# Patient Record
Sex: Male | Born: 1940 | Race: Black or African American | Hispanic: No | Smoking: Never smoker
Health system: Southern US, Community
[De-identification: ages and names within clinical notes are randomized; demographics above are authoritative.]

## PROBLEM LIST (undated history)

## (undated) DIAGNOSIS — R569 Unspecified convulsions: Secondary | ICD-10-CM

## (undated) DIAGNOSIS — I10 Essential (primary) hypertension: Secondary | ICD-10-CM

## (undated) DIAGNOSIS — E119 Type 2 diabetes mellitus without complications: Secondary | ICD-10-CM

## (undated) DIAGNOSIS — I509 Heart failure, unspecified: Secondary | ICD-10-CM

## (undated) HISTORY — PX: CORONARY ARTERY BYPASS GRAFT: SHX141

---

## 1998-07-02 ENCOUNTER — Emergency Department (HOSPITAL_COMMUNITY): Admission: EM | Admit: 1998-07-02 | Discharge: 1998-07-02 | Payer: Self-pay | Admitting: Emergency Medicine

## 1999-01-05 ENCOUNTER — Encounter: Payer: Self-pay | Admitting: Emergency Medicine

## 1999-01-05 ENCOUNTER — Emergency Department (HOSPITAL_COMMUNITY): Admission: EM | Admit: 1999-01-05 | Discharge: 1999-01-05 | Payer: Self-pay | Admitting: Emergency Medicine

## 2000-02-03 ENCOUNTER — Ambulatory Visit (HOSPITAL_COMMUNITY): Admission: RE | Admit: 2000-02-03 | Discharge: 2000-02-03 | Payer: Self-pay | Admitting: Family Medicine

## 2000-02-03 ENCOUNTER — Encounter: Payer: Self-pay | Admitting: Family Medicine

## 2001-08-07 ENCOUNTER — Encounter: Payer: Self-pay | Admitting: Family Medicine

## 2001-08-07 ENCOUNTER — Ambulatory Visit (HOSPITAL_COMMUNITY): Admission: RE | Admit: 2001-08-07 | Discharge: 2001-08-07 | Payer: Self-pay | Admitting: Family Medicine

## 2001-09-08 ENCOUNTER — Emergency Department (HOSPITAL_COMMUNITY): Admission: EM | Admit: 2001-09-08 | Discharge: 2001-09-08 | Payer: Self-pay | Admitting: Emergency Medicine

## 2002-07-23 ENCOUNTER — Encounter (HOSPITAL_BASED_OUTPATIENT_CLINIC_OR_DEPARTMENT_OTHER): Admission: RE | Admit: 2002-07-23 | Discharge: 2002-10-21 | Payer: Self-pay | Admitting: Internal Medicine

## 2002-08-12 ENCOUNTER — Encounter: Payer: Self-pay | Admitting: Orthopedic Surgery

## 2002-08-12 ENCOUNTER — Encounter: Admission: RE | Admit: 2002-08-12 | Discharge: 2002-08-12 | Payer: Self-pay | Admitting: Orthopedic Surgery

## 2002-10-09 ENCOUNTER — Ambulatory Visit (HOSPITAL_COMMUNITY): Admission: RE | Admit: 2002-10-09 | Discharge: 2002-10-09 | Payer: Self-pay | Admitting: Family Medicine

## 2002-10-09 ENCOUNTER — Encounter: Payer: Self-pay | Admitting: Family Medicine

## 2003-11-20 ENCOUNTER — Ambulatory Visit: Payer: Self-pay | Admitting: Family Medicine

## 2003-11-27 ENCOUNTER — Ambulatory Visit: Payer: Self-pay | Admitting: *Deleted

## 2003-12-29 ENCOUNTER — Ambulatory Visit: Payer: Self-pay | Admitting: *Deleted

## 2004-02-18 ENCOUNTER — Ambulatory Visit: Payer: Self-pay | Admitting: Family Medicine

## 2004-04-22 ENCOUNTER — Ambulatory Visit: Payer: Self-pay | Admitting: Family Medicine

## 2005-03-17 ENCOUNTER — Ambulatory Visit: Payer: Self-pay | Admitting: Family Medicine

## 2005-04-24 ENCOUNTER — Ambulatory Visit: Payer: Self-pay | Admitting: Family Medicine

## 2005-06-15 ENCOUNTER — Ambulatory Visit: Payer: Self-pay | Admitting: Family Medicine

## 2005-09-04 ENCOUNTER — Ambulatory Visit: Payer: Self-pay | Admitting: Family Medicine

## 2006-02-06 ENCOUNTER — Ambulatory Visit: Payer: Self-pay | Admitting: Family Medicine

## 2006-05-04 ENCOUNTER — Ambulatory Visit: Payer: Self-pay | Admitting: Family Medicine

## 2006-07-09 ENCOUNTER — Ambulatory Visit: Payer: Self-pay | Admitting: Family Medicine

## 2006-09-03 ENCOUNTER — Ambulatory Visit: Payer: Self-pay | Admitting: Family Medicine

## 2006-11-15 ENCOUNTER — Ambulatory Visit: Payer: Self-pay | Admitting: Cardiology

## 2006-11-15 ENCOUNTER — Inpatient Hospital Stay (HOSPITAL_COMMUNITY): Admission: EM | Admit: 2006-11-15 | Discharge: 2006-12-03 | Payer: Self-pay | Admitting: Emergency Medicine

## 2006-11-21 ENCOUNTER — Encounter (INDEPENDENT_AMBULATORY_CARE_PROVIDER_SITE_OTHER): Payer: Self-pay | Admitting: Internal Medicine

## 2006-11-21 ENCOUNTER — Ambulatory Visit: Payer: Self-pay | Admitting: Cardiovascular Disease

## 2006-12-12 ENCOUNTER — Ambulatory Visit: Payer: Self-pay | Admitting: Cardiology

## 2006-12-12 LAB — CONVERTED CEMR LAB
Basophils Absolute: 0 10*3/uL (ref 0.0–0.1)
Chloride: 106 meq/L (ref 96–112)
Eosinophils Absolute: 0.2 10*3/uL (ref 0.0–0.6)
Eosinophils Relative: 2.6 % (ref 0.0–5.0)
GFR calc Af Amer: 49 mL/min
GFR calc non Af Amer: 40 mL/min
Glucose, Bld: 88 mg/dL (ref 70–99)
HCT: 34.9 % — ABNORMAL LOW (ref 39.0–52.0)
Lymphocytes Relative: 17.7 % (ref 12.0–46.0)
MCHC: 33.5 g/dL (ref 30.0–36.0)
MCV: 97.8 fL (ref 78.0–100.0)
Neutro Abs: 5.1 10*3/uL (ref 1.4–7.7)
Neutrophils Relative %: 64.4 % (ref 43.0–77.0)
Platelets: 221 10*3/uL (ref 150–400)
RBC: 3.57 M/uL — ABNORMAL LOW (ref 4.22–5.81)
Sodium: 138 meq/L (ref 135–145)
WBC: 7.9 10*3/uL (ref 4.5–10.5)

## 2006-12-19 ENCOUNTER — Ambulatory Visit: Payer: Self-pay | Admitting: Family Medicine

## 2006-12-24 ENCOUNTER — Inpatient Hospital Stay (HOSPITAL_COMMUNITY): Admission: AD | Admit: 2006-12-24 | Discharge: 2006-12-26 | Payer: Self-pay | Admitting: Cardiology

## 2006-12-24 ENCOUNTER — Ambulatory Visit: Payer: Self-pay | Admitting: Cardiology

## 2007-01-02 ENCOUNTER — Ambulatory Visit: Payer: Self-pay | Admitting: Cardiology

## 2007-01-02 LAB — CONVERTED CEMR LAB
BUN: 50 mg/dL — ABNORMAL HIGH (ref 6–23)
GFR calc Af Amer: 60 mL/min
GFR calc non Af Amer: 50 mL/min
Potassium: 5 meq/L (ref 3.5–5.1)
Sodium: 138 meq/L (ref 135–145)

## 2007-01-08 ENCOUNTER — Emergency Department (HOSPITAL_COMMUNITY): Admission: EM | Admit: 2007-01-08 | Discharge: 2007-01-08 | Payer: Self-pay | Admitting: Emergency Medicine

## 2007-01-15 ENCOUNTER — Ambulatory Visit: Payer: Self-pay | Admitting: Cardiology

## 2007-02-08 ENCOUNTER — Encounter: Payer: Self-pay | Admitting: Endocrinology

## 2007-02-18 ENCOUNTER — Ambulatory Visit: Payer: Self-pay | Admitting: Internal Medicine

## 2007-02-18 LAB — CONVERTED CEMR LAB
BUN: 86 mg/dL (ref 6–23)
Calcium: 9.2 mg/dL (ref 8.4–10.5)
GFR calc Af Amer: 46 mL/min
GFR calc non Af Amer: 38 mL/min
Potassium: 4.5 meq/L (ref 3.5–5.1)
Pro B Natriuretic peptide (BNP): 203 pg/mL — ABNORMAL HIGH (ref 0.0–100.0)

## 2007-02-28 ENCOUNTER — Ambulatory Visit: Payer: Self-pay | Admitting: Family Medicine

## 2007-03-25 ENCOUNTER — Ambulatory Visit: Payer: Self-pay | Admitting: Cardiology

## 2007-04-22 ENCOUNTER — Emergency Department (HOSPITAL_COMMUNITY): Admission: EM | Admit: 2007-04-22 | Discharge: 2007-04-22 | Payer: Self-pay | Admitting: Emergency Medicine

## 2007-05-01 ENCOUNTER — Ambulatory Visit: Payer: Self-pay | Admitting: Cardiology

## 2007-05-08 ENCOUNTER — Encounter: Payer: Self-pay | Admitting: Endocrinology

## 2007-06-21 ENCOUNTER — Emergency Department (HOSPITAL_COMMUNITY): Admission: EM | Admit: 2007-06-21 | Discharge: 2007-06-22 | Payer: Self-pay | Admitting: Emergency Medicine

## 2007-06-22 ENCOUNTER — Ambulatory Visit: Payer: Self-pay | Admitting: Vascular Surgery

## 2007-06-22 ENCOUNTER — Encounter (INDEPENDENT_AMBULATORY_CARE_PROVIDER_SITE_OTHER): Payer: Self-pay | Admitting: Emergency Medicine

## 2007-06-22 ENCOUNTER — Ambulatory Visit (HOSPITAL_COMMUNITY): Admission: RE | Admit: 2007-06-22 | Discharge: 2007-06-22 | Payer: Self-pay | Admitting: Emergency Medicine

## 2007-07-04 ENCOUNTER — Ambulatory Visit: Payer: Self-pay | Admitting: Internal Medicine

## 2007-07-22 ENCOUNTER — Ambulatory Visit: Payer: Self-pay | Admitting: Cardiovascular Disease

## 2007-08-05 ENCOUNTER — Ambulatory Visit: Payer: Self-pay | Admitting: Family Medicine

## 2007-08-26 ENCOUNTER — Ambulatory Visit: Payer: Self-pay

## 2007-08-26 ENCOUNTER — Encounter: Payer: Self-pay | Admitting: Cardiology

## 2007-08-28 ENCOUNTER — Ambulatory Visit: Payer: Self-pay | Admitting: Cardiology

## 2007-09-09 ENCOUNTER — Emergency Department (HOSPITAL_COMMUNITY): Admission: EM | Admit: 2007-09-09 | Discharge: 2007-09-10 | Payer: Self-pay | Admitting: Emergency Medicine

## 2008-11-03 ENCOUNTER — Encounter: Payer: Self-pay | Admitting: Cardiology

## 2009-03-31 IMAGING — CR DG CHEST 1V PORT
1 series · 1 of 1 positions shown · non-contrast
Comparison: Chest, 11/24/2006 at 3748 hours

CLINICAL DATA: PICC line placement

Exam: Chest portable one view

[view not recorded]
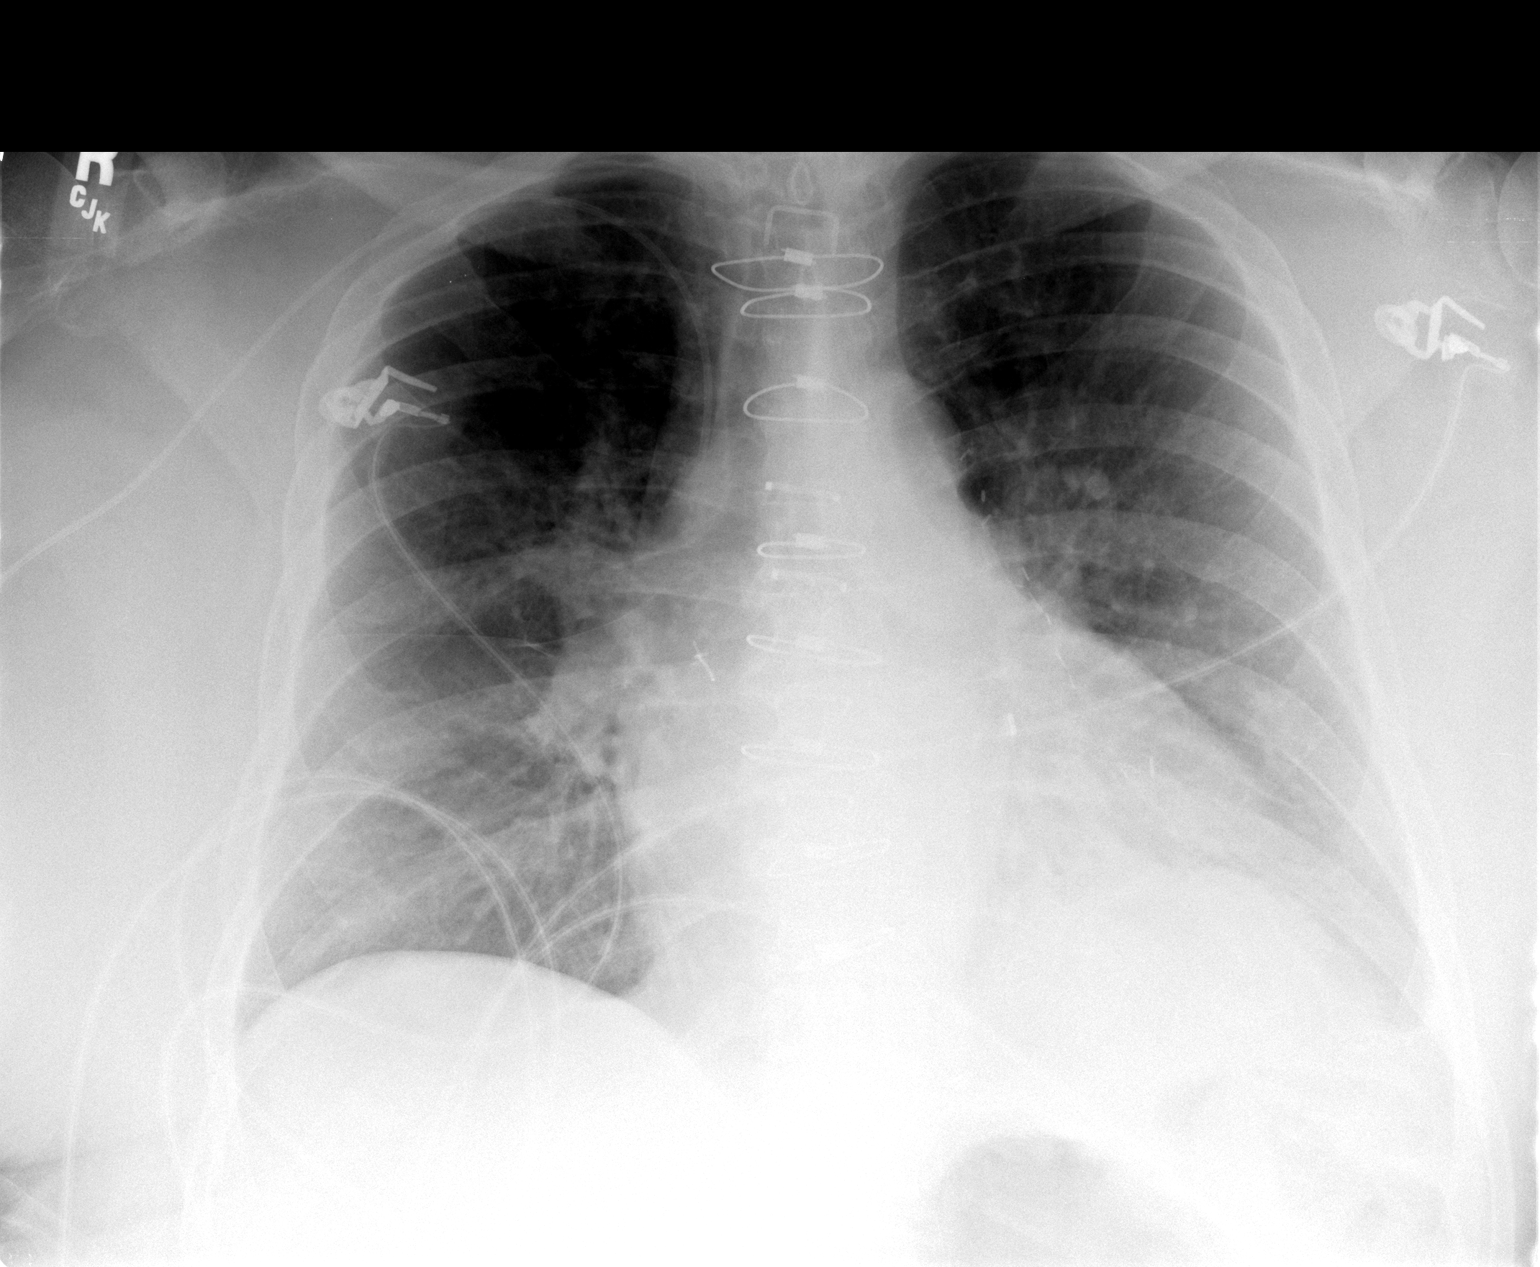

[1 of 1 positions shown; findings below may reference images not displayed]

FINDINGS: Right-sided PICC line placement with tip in the distal SVC. No
pneumothorax. Stable enlarged silhouette. Stable left retrocardiac opacity.
Stable right basilar atelectasis and mild venous congestion.
IMPRESSION: 1. Right PICC line placement with tip in the distal SVC.
2. No significant interval change otherwise.

## 2009-03-31 IMAGING — CR DG CHEST 1V PORT
2 series · 2 of 2 positions shown · non-contrast
Comparison: 11/20/06.

CLINICAL DATA: CHF.  Diabetic. 
 PORTABLE CHEST ? 1 VIEW ? 11/24/06 ? 1911 HOURS:

[view not recorded (1 of 2)]
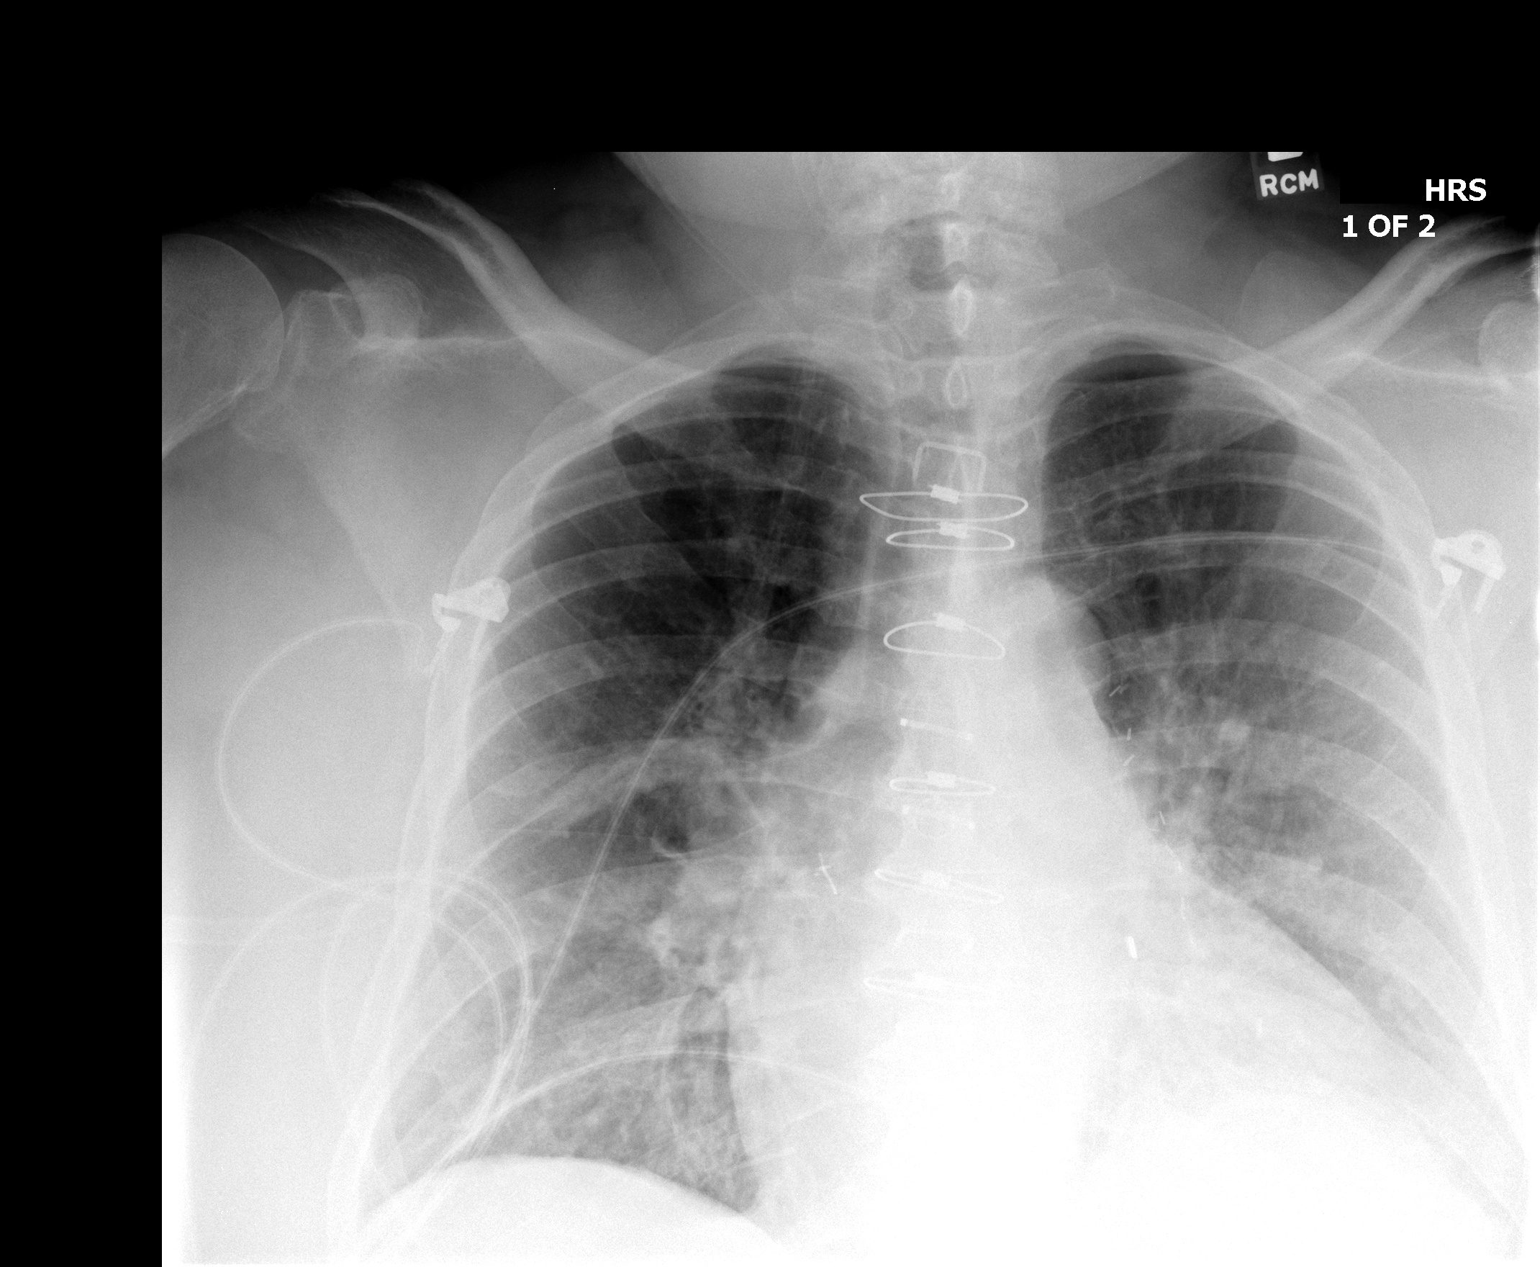

[view not recorded (2 of 2)]
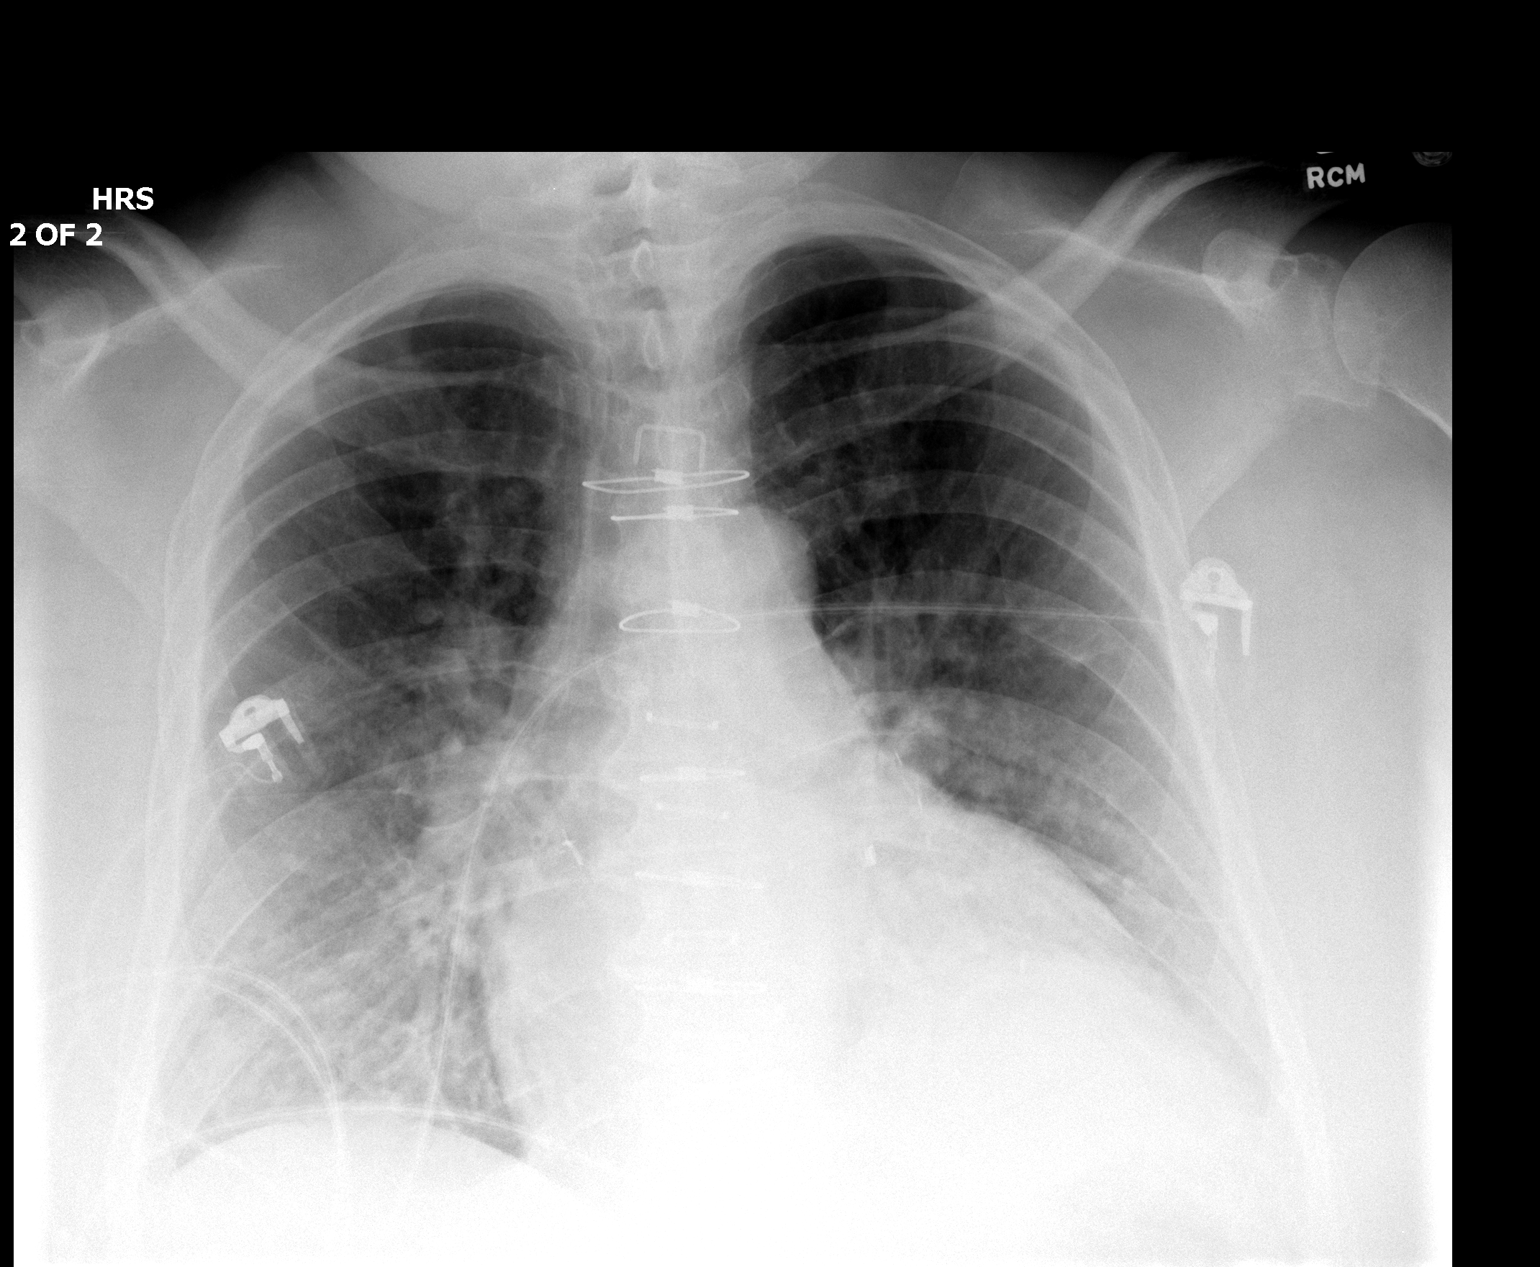

[2 of 2 positions shown; findings below may reference images not displayed]

FINDINGS: Cardiomegaly.   Pulmonary vascular congestion/mild congestive heart failure unchanged.  Elevated left hemidiaphragm with left base subsegmental atelectasis.  Limited for detection of left base infectious infiltrate.
IMPRESSION: No significant change.

## 2009-05-05 ENCOUNTER — Encounter: Payer: Self-pay | Admitting: Cardiology

## 2009-09-16 ENCOUNTER — Telehealth (INDEPENDENT_AMBULATORY_CARE_PROVIDER_SITE_OTHER): Payer: Self-pay | Admitting: *Deleted

## 2009-12-27 ENCOUNTER — Emergency Department (HOSPITAL_COMMUNITY): Admission: EM | Admit: 2009-12-27 | Discharge: 2009-12-27 | Payer: Self-pay | Admitting: Emergency Medicine

## 2010-01-03 ENCOUNTER — Telehealth (INDEPENDENT_AMBULATORY_CARE_PROVIDER_SITE_OTHER): Payer: Self-pay | Admitting: *Deleted

## 2010-01-04 ENCOUNTER — Inpatient Hospital Stay (HOSPITAL_COMMUNITY): Admission: RE | Admit: 2010-01-04 | Discharge: 2010-01-06 | Payer: Self-pay | Admitting: Orthopedic Surgery

## 2010-04-19 NOTE — Letter (Signed)
Summary: [facility] Office Note  [facility] Office Note   Imported By: Roderic Ovens 05/31/2009 14:39:14  _____________________________________________________________________  External Attachment:    Type:   Image     Comment:   External Document

## 2010-04-19 NOTE — Progress Notes (Signed)
    Records faxed to [facility] @ [phone] [facility]  January 03, 2010 2:40 PM

## 2010-04-19 NOTE — Progress Notes (Signed)
  Phone Note Other Incoming   Request: Send information Summary of Call: Request for records received from [employer]. Request forwarded to [facility].

## 2010-05-25 ENCOUNTER — Encounter: Payer: Self-pay | Admitting: Cardiology

## 2010-06-01 LAB — GLUCOSE, CAPILLARY
Glucose-Capillary: 113 mg/dL — ABNORMAL HIGH (ref 70–99)
Glucose-Capillary: 132 mg/dL — ABNORMAL HIGH (ref 70–99)
Glucose-Capillary: 140 mg/dL — ABNORMAL HIGH (ref 70–99)
Glucose-Capillary: 143 mg/dL — ABNORMAL HIGH (ref 70–99)
Glucose-Capillary: 187 mg/dL — ABNORMAL HIGH (ref 70–99)

## 2010-06-01 LAB — CROSSMATCH: Antibody Screen: NEGATIVE

## 2010-06-01 LAB — CBC
MCH: 32.2 pg (ref 26.0–34.0)
MCHC: 33.4 g/dL (ref 30.0–36.0)
MCV: 96.4 fL (ref 78.0–100.0)
Platelets: 196 10*3/uL (ref 150–400)
Platelets: 208 10*3/uL (ref 150–400)
RDW: 12.6 % (ref 11.5–15.5)
RDW: 17.9 % — ABNORMAL HIGH (ref 11.5–15.5)
WBC: 10.8 10*3/uL — ABNORMAL HIGH (ref 4.0–10.5)
WBC: 5.7 10*3/uL (ref 4.0–10.5)

## 2010-06-01 LAB — COMPREHENSIVE METABOLIC PANEL
ALT: 26 U/L (ref 0–53)
Albumin: 3.2 g/dL — ABNORMAL LOW (ref 3.5–5.2)
Alkaline Phosphatase: 60 U/L (ref 39–117)
Potassium: 4.7 mEq/L (ref 3.5–5.1)
Sodium: 138 mEq/L (ref 135–145)
Total Protein: 7 g/dL (ref 6.0–8.3)

## 2010-06-01 LAB — BASIC METABOLIC PANEL
BUN: 68 mg/dL — ABNORMAL HIGH (ref 6–23)
CO2: 25 mEq/L (ref 19–32)
Chloride: 105 mEq/L (ref 96–112)
Creatinine, Ser: 1.66 mg/dL — ABNORMAL HIGH (ref 0.4–1.5)
Glucose, Bld: 138 mg/dL — ABNORMAL HIGH (ref 70–99)

## 2010-06-01 LAB — POCT I-STAT 4, (NA,K, GLUC, HGB,HCT)
Glucose, Bld: 142 mg/dL — ABNORMAL HIGH (ref 70–99)
Hemoglobin: 8.2 g/dL — ABNORMAL LOW (ref 13.0–17.0)

## 2010-06-01 LAB — SURGICAL PCR SCREEN: MRSA, PCR: NEGATIVE

## 2010-06-01 LAB — HEMOGLOBIN AND HEMATOCRIT, BLOOD: Hemoglobin: 10.9 g/dL — ABNORMAL LOW (ref 13.0–17.0)

## 2010-06-01 LAB — PROTIME-INR: INR: 1.18 (ref 0.00–1.49)

## 2010-08-02 NOTE — Discharge Summary (Signed)
NAME:  ARLEE, SANTOSUOSSO NO.:  [id number]   MEDICAL RECORD NO.:  [id number]          PATIENT TYPE:  INP   LOCATION:  2024                         FACILITY:  [facility]   PHYSICIAN:  Hettie Holstein, D.O.    DATE OF BIRTH:  11/17/1941   DATE OF ADMISSION:  11/14/2006  DATE OF DISCHARGE:                               DISCHARGE SUMMARY   ADDENDUM   In addendum Mr. Kyllo subsequent hospital course had been that of  marked improvement especially with respect to his renal function.  He  diuresed excellent volumes in response to diuretics and subsequently  spontaneously.  His weight at time of discharge was 117 kilograms,  whereas he was admitted on the November 25, 2006 at 132 kilograms.  In  any event his renal function did steadily improve giving [place] some room to  initiate the ACE inhibitor, and his medications upon discharge will be  as follows:   DISCHARGE MEDICATIONS:  1. Aspirin 81 mg daily.  2. Dilantin 300 mg p.o. q. h.s.  3. Coreg 25 mg twice daily.  4. Lasix 80 mg twice daily.  5. K-Dur 20 mEq twice daily.  6. Enalapril 5 mg twice daily.  7. He will continue his Norvasc at 10 mg daily.  8. Lipitor 40 mg daily.  9. Diabetes regimen will include Lantus at 17 units q. h.s.  He will      be instructed to increase this by 1 unit each night until his a.m.      blood sugar is less than 100, and is provided with hypoglycemia      instructions per the skilled nursing staff.  He should administer      NovoLog 4 units q. a.c. as well.   He did respond quite well to a pulse dose of colchicine for his gout.  I  suggested this may be useful in the outpatient setting.   DISPOSITION:  At present he does already have a followup with Dr.  Gala Romney on December 12, 2006 at 1400 hours, and Dr. Annie Sable at [facility] whom I asked him to followup.  I would like for him to have a followup basic metabolic panel this week  to ensure that his renal  function and electrolytes are remaining stable  as he is being initiated on 2 new medications; Coreg and Enalapril.      Hettie Holstein, D.O.  Electronically Signed     ESS/MEDQ  D:  12/03/2006  T:  12/03/2006  Job:  [id number]   cc:   Maurice March, M.D.  [name], MD  Cecille Aver, M.D.

## 2010-08-02 NOTE — Assessment & Plan Note (Signed)
[facility]                            CARDIOLOGY OFFICE NOTE   NAME:[name], [name]                       MRN:          [id number]  DATE:08/28/2007                            DOB:          08-Jan-1941    PRIMARY CARE PHYSICIAN:  Maurice March, MD   REASON FOR PRESENTATION:  Evaluate the patient with previous ischemic  cardiomyopathy.   HISTORY OF PRESENT ILLNESS:  The patient presents for followup of the  above.  I am pleased to say that on August 26, 2007, he had an  echocardiogram demonstrating that his EF is 50-55%.  He did have some  regional wall motion abnormality with hypokinesis of the periapical  wall.  He did have some evidence of elevated diastolic filling  pressures.   The patient says he is doing well from a cardiovascular standpoint.  He  is going to start doing a little walking.  He has not been doing much of  this because he has gout in his knees, but this is improved.  He has no  new shortness of breath.  He denies any PND or orthopnea.  Unfortunately, he is still sneaking salt.  He denies any chest  pressure, neck discomfort, or arm discomfort.  He has had no  palpitation, presyncope, or syncope.  He has chronic lower extremity  swelling, which improves when he has his feet up.   PAST MEDICAL HISTORY:  Congestive heart failure with ischemic  cardiomyopathy (EF had been 30%, most recently 50-55%), coronary artery  disease with CABG in 1991 (catheterization in 2008 demonstrated  saphenous vein graft to diagonal 1 and diagonal 2, and diagonal 3 was  occluded at the ostium, LIMA to the LAD was widely patent, and filled  the large LAD which wrapped the apex and fed some collaterals to the PDA  circulation as well as scant collaterals to the diagonal vessel.  The  patient had an occluded LAD.  He had subtotal stenosis of the diagonal 1  and occluded second and third diagonals.  The circumflex had a ramus  intermediate with 80%  stenosis.  There was subtotal stenosis in the AV  groove before posterolateral and PDA.  The PDA had subtotal stenosis.  He was managed medically.)  Chronic renal insufficiency (stable and  followed by Dr. Kathrene Bongo), diabetes mellitus, hypertension,  hyperlipidemia, seizure disorder requiring Dilantin,  osteoarthritis/degenerative joint disease, gout, and chronic anemia.   ALLERGIES/INTOLERANCES:  OXYCODONE.   MEDICATIONS:  1. Klor-Con.  2. Allopurinol 100 mg daily.  3. Coreg 50 mg b.i.d.  4. Furosemide 120 mg b.i.d.  5. Enalapril 5 mg b.i.d.  6. Aspirin 325 mg daily.  7. Lipitor 40 mg daily.  8. Amlodipine 10 mg daily.  9. Dilantin 300 mg nightly.  10.NovoLog.  11.Lantus.   REVIEW OF SYSTEMS:  As stated in the HPI, otherwise negative for other  systems.   PHYSICAL EXAMINATION:  GENERAL:  The patient is in no distress.  VITAL SIGNS:  Blood pressure 165/78, heart rate 61 and regular.  NECK:  No jugular venous distention at 45 degrees.  Normal  carotid  upstroke brisk and symmetrical.  No bruits.  LUNGS:  Clear to auscultation bilaterally.  CHEST:  Well-healed sternotomy scar.  HEART:  PMI not displaced or sustained.  S1 and S2 within normal limits.  No S3, no S4, no clicks, no rubs, and no murmurs.  ABDOMEN:  Obese.  Positive bowel sounds.  Normal in frequency and pitch.  No bruits, no rebound, no guarding or midline pulsatile mass.  No  organomegaly.  EXTREMITIES:  Pulses 2+ with moderate bilateral lower extremity edema.  NEUROLOGIC:  Grossly intact.   EKG, sinus rhythm, poor anterior R-wave progression with probable old  anterior infarct, left axis deviation, and probable old inferior  infarct.   ASSESSMENT/PLAN:  1. Coronary disease.  The patient is having no new symptoms.  No      further cardiovascular testing is suggested.  He will continue with      medical management.  2. Ischemic cardiomyopathy.  His ejection fraction is improved, and he      will continue  the medications as listed.  3. Lower extremity edema.  We discussed again salt restriction and      dietary compliance.  4. Obesity.  As above, he understands the need to lose weight with      diet and exercise.  5. Renal insufficiency.  This is stable and he will remain on the      medications as listed.  6. Dyslipidemia.  The patient remains on Lipitor.  I will defer      followup to Dr. Audria Nine unless requested otherwise.  Goal being      LDL less than 70 and HDL greater than 40.  7. Followup.  We will see him back in 1 year or sooner if needed.     Rollene Rotunda, MD, [facility]  Electronically Signed    JH/MedQ  DD: 08/28/2007  DT: 08/28/2007  Job #: [id number]   cc:   Maurice March, M.D.

## 2010-08-02 NOTE — Discharge Summary (Signed)
NAME:  [name], [name] NO.:  [id number]   MEDICAL RECORD NO.:  [id number]          PATIENT TYPE:  INP   LOCATION:  2024                         FACILITY:  [facility]   PHYSICIAN:  Isidor Holts, M.D.  DATE OF BIRTH:  11/17/1941   DATE OF ADMISSION:  11/14/2006  DATE OF DISCHARGE:                               DISCHARGE SUMMARY   ADDENDUM DISCHARGE SUMMARY   DATE OF DISCHARGE:  To be determined.   PRIMARY CARE PHYSICIAN:  Janelle Floor, DO, [facility].   DISCHARGE DIAGNOSES:  1. Altered mental status, multifactorial.  2. Hypertension.  3. Coronary artery disease/subendocardial ischemia and elevated      troponin's.  4. Acute on chronic renal failure.  5. Fluid overload, secondary to congestive heart failure and renal      failure.  6. Obesity.  7. Type 2 diabetes mellitus.  8. Dyslipidemia.  9. Seizure disorder.  10.Acute flareup of gout.  11.Left frontal sinus partially calcified mucocele.  12.Urinary tract infection.  13.Anemia, requiring transfusion of 1 unit packed red blood cells on      November 24, 2006.  14.Rhabdomyolysis secondary to Statin treatment with seizure disorder.   DISCHARGE MEDICATIONS:  These will be listed in addendum at the  appropriate time by discharging M.D.   CONSULTATIONS:  Refer to interim discharge summary dated November 20, 2006 by Dr. Jonna Munro.  In addition:  1. Cecille Aver, M.D., nephrologist.  2. Gerrit Friends. Dietrich Pates, M.D., cardiologist.   PROCEDURES:  Refer to above-mentioned interim summary.  In addition:  1. Renal ultrasound scan dated November 23, 2006 that showed no acute      findings.  2. Chest x-ray dated November 24, 2006 that showed right PICC line      placement with tip in the distal SVC.  No significant interval      change.  There was stable right basilar atelectasis and mild venous      congestion/mild congestive heart failure  3. 2-dimensional echocardiogram dated November 21, 2006 that showed      left ventricle mildly dilated; overall left ventricular systolic      function was mildly decreased.  Aortic valve was mildly calcified.      There was mild mitral valvular regurgitation.  The left atrium was      mildly dilated.  Poor image quality, unable to assess ejection      fraction but there may be septal and inferior wall hypokinesis.   HISTORY AND CLINICAL HOSPITAL COURSE:  For admission history and  clinical course, refer to interim summary dated November 20, 2006 by Dr.  Christella Noa, however, for the period from November 21, 2006 to the date of  this dictation on November 27, 2006, the following are pertinent.  The  patient's mental status alteration was likely multifactorial secondary  to multiple acute problems, however, this has resolved and the patient  is back to baseline.  Hypertension is well controlled.  Although the  patient has no symptoms of chest pain, he had elevated cardiac enzymes  suspicious for subendocardial ischemia, necessitating requesting a  cardiology consultation which was kindly provided by Dr. Dietrich Pates.  He  underwent a 2-D echocardiogram which suggests inferior wall and septal  hypokinesis and clearly, his impaired renal functions militated against  cardiac catheterization.  A stress Myoview is therefore planned to  evaluate this.  At the time of this dictation, however, this was still  pending.  The patient has become progressively fluid overloaded and  anasarcic, against a background of deteriorating renal function.  Renal  consultation was called.  This was kindly provided by Dr. Annie Sable and the renal team, who have been very helpful in the  management of the patient's renal status, utilizing fluid restriction  and high dose intravenous diuretics.  Response, however, has been less  than optimal.  The patient did have rhabdomyolysis, deemed likely  secondary to Statin treatment versus seizure and initially CPK  was  significantly elevated at 1,633 but it is noted that as of November 20, 2006, the CPK had normalized at 174.  Statin has been discontinued and  this should be certainly avoided in the future.  The patient's urinary  tract infection has been treated with Keflex and left frontal sinus  lesion noted on imaging studies, was a partially calcified mucocele and  of no significant import at the moment. He has had no seizure episodes  during the above-mentioned period of time.  Diabetes has been addressed  with scheduled Lantus Insulin, carbohydrate-modified diet, sliding scale  insulin coverage, with improved glycemic levels.  The patient did have  an acute flare-up of gout, right knee with a uric acid level in the  serum of 10.0.  This has been addressed with steroids, as NSAID's are  contraindicated given his background renal dysfunction.  He did have a  significant anemia of 8.4 on November 22, 2006.  This as addressed with  transfusion of 1 unit of packed red blood cells on November 24, 2006  resulting in a satisfactory bump in hemoglobin level to 9.3 on November 25, 2006.  Hemoglobin levels have remained stable/reasonable since and  was 9.1 on November 27, 2006.   DISPOSITION:  This will be elucidated in detail at the appropriate time  by the discharging M.D. in an addendum.  However, it is clear that the  patient's main problem at the present time is that of deteriorating  renal function as well as suspect cardiac status.  As mentioned above,  he is due to have a stress Myoview within the next few days, and further  management thereafter will depend upon cardiology recommendations.  It  is anticipated that the patient will recover sufficiently within the  next few days to be considered for discharge, however, it is possible  that in the medium to long-term he may eventually require renal  replacement therapy.      Isidor Holts, M.D.  Electronically Signed     CO/MEDQ  D:   11/27/2006  T:  11/27/2006  Job:  [id number]   cc:   Janelle Floor, DO

## 2010-08-02 NOTE — Consult Note (Signed)
NAME:  [name], [name] NO.:  [id number]   MEDICAL RECORD NO.:  [id number]          PATIENT TYPE:  INP   LOCATION:  5707                         FACILITY:  [facility]   PHYSICIAN:  Hewitt Shorts, M.D.DATE OF BIRTH:  11/17/1941   DATE OF CONSULTATION:  11/16/2006  DATE OF DISCHARGE:                                 CONSULTATION   HISTORY OF PRESENT ILLNESS:  The patient is a 70 year old black male who  was admitted to [facility] by the [facility]  yesterday after probably having had a seizure at home.  He was brought  to the emergency room by [facility].  The patient does not  recall a specific seizure, but he was found by his fiance to not look  right and he awoke in the emergency room after having had some sort of  spell.   Patient was evaluated in the emergency room.  CT scan of the head was  performed.  It did show significant generalized cerebral atrophy, as  well as a small encephalomalacia on the inferior aspect of the left  frontal lobe and abnormalities within the left frontal sinus.  Recommendations were made for further evaluation with MRI scan, and this  has been subsequently performed.  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  This study was  reviewed with Dr. Gennette Pac from the radiology service, in  conjunction with a subsequent CT of the maxillofacial region.  He does  not believe that an encephalocele is present, but rather the primary  process is most likely that of a chronic mucocele that caused some  erosion of the posterior wall of the frontal sinus.   At the time of admission he was noted to have profound prerenal azotemia  with a BUN and creatinine of 90 and 2.5 respectively.  He has been  hydrated with intravenous fluid, and his laboratories this morning  showed a BUN and creatinine of 66 and 1.96 respectively.  History is  also notable for diabetes,  hypertension, coronary artery disease status  post CABG and hypercholesterolemia.   The patient has been seen in ENT consultation with Dr. Osborn Coho  who anticipated eventual left frontal sinus surgery in a few weeks.  However, he recommended neurosurgical evaluation regarding the question  of encephalocele and neurosurgery consultation was requested.   PAST MEDICAL HISTORY:  Notable for his history of diabetes,  hypertension, osteoarthritis, coronary artery disease status post CABG  of 7 vessels in 1991.  Also he reports a history of a seizure many years  ago currently related to an abscessed tooth.  He has hyperlipidemia and  had a right echo fracture which has been repaired surgically.  He has  had cataract surgery.   PAST SURGICAL HISTORY:  Seven vessel CABG in 1991, bilateral cataract  surgery in 2005 and 2006 and right ankle ORIF in 1994.   MEDICATIONS:  1. Avandia 40 mg daily.  2. Flexeril 10 mg p.r.n.  3. Darvocet p.r.n.  4. Aspirin 325 mg daily.  5. Enalapril 10  mg daily.  6. Lasix 40 mg b.i.d.  7. Labetalol 800 mg b.i.d.  8. Lipitor 40 mg daily.  9. Norvasc 10 mg daily.  10.Zaroxolyn 5 mg b.i.d.  11.Zoloft 50 mg daily.   ALLERGIES:  CODEINE.   FAMILY HISTORY:  Parents have passed on.  His mother had congestive  heart failure and hypertension.  Father had congestive heart failure.  Three brothers are alive and well.  Two brothers have passed on, one  from alcohol and one from motorcycle.  One sister is alive and well.  One sister is passed on from colon cancer.   SOCIAL HISTORY:  The patient is divorced.  He has two sons, two  daughters and ten grandchildren.  He is engaged.  He is retired.  He  does not smoke, drink alcoholic beverages or have a history of substance  abuse.   REVIEW OF SYSTEMS:  Notable for those issues describes in history of  present illness and past medical history.  He does have poor dentition,  but his review of systems is otherwise  unremarkable.   PHYSICAL EXAMINATION:  GENERAL:  The patient is a somewhat heavy-set  black male in no acute distress.  VITAL SIGNS:  Temperature 98.3, pulse 66, blood pressure 126/63,  respiratory rate 20.  LUNGS:  Clear to auscultation.  He has symmetric respiratory excursion.  HEART:  Regular rate and rhythm.  Normal S1 and S2.  There is no murmur.  EXTREMITIES:  No clubbing, cyanosis or edema.  NEUROLOGIC:  Normal mental status.  The patient is awake, alert, fully  oriented to his name, [facility] and August of 2008.  Speech is  fluent.  He has good comprehension.  Cranial nerves show pupils equal,  round and reactive to light.  Extraocular movements intact.  Facial  movement is symmetrical.  Hearing is present.  Palate movement is  symmetrical.  Tongue is midline.  Motor examination shows 5/5 strength  to the upper and lower extremities including deltoids, biceps, triceps,  intrinsics, grips, iliopsoas, quadriceps, dorsiflexors, tibialis longus  and plantar flexors bilaterally.  Sensation is intact to pinprick in the  upper and lower extremities.  Reflexes are absent in the biceps,  brachialis and triceps, minimal in the quadriceps, absent in the  gastrocs, symmetric bilaterally.  Toes are downgoing bilaterally.  Normal gait and stance.   IMPRESSIONS:  1. Probable seizure, possibly related to area of encephalomalacia in      the inferior aspect of the left frontal lobe.  2. 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  3. Generalized cerebral atrophy.  4. 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  5. Significant prerenal azotemia with a BUN and creatinine on      admission of 90 and 2.45 and one day after admission of 66 and      1.96.  6. Diabetes, hypertension, coronary artery disease status post CABG      and hypercholesterolemia.   RECOMMENDATIONS:  I discussed by assessment and recommendations with the   patient and also discussed then with Dr. Osborn Coho, the ENT  consultant.   1. Regarding his probable seizure activity, I feel that neurology      consultation would be helpful.  I feel they will need to assess      whether they feel seizure has occurred and whether they feel      treatment should be initiated and certainly to arrange for  ongoing      followup with them regarding the question of seizure disorder.  2. MRI of the brain with and without gadolinium if his azotemia is      corrected, which will help determine whether an encephalocele is      present, although it probably is not.  3. I tend to favor avoiding surgery in the left frontal sinus unless      it becomes imperative.  I think that there is significant risk with      sinus surgery creating communication into the intracranial cavity      and with that encountering significant complications.   I discussed this with Dr. Annalee Genta, and he understands and agrees with  my concerns.  He is planning on following up with the patient in his  office and will be in touch with me as needed.      Hewitt Shorts, M.D.  Electronically Signed     RWN/MEDQ  D:  11/16/2006  T:  11/17/2006  Job:  [id number]

## 2010-08-02 NOTE — Consult Note (Signed)
NAME:  [name] NO.:  [id number]   MEDICAL RECORD NO.:  [id number]          PATIENT TYPE:  INP   LOCATION:  5707                         FACILITY:  [facility]   PHYSICIAN:  Marlan Palau, M.D.  DATE OF BIRTH:  11/17/1941   DATE OF CONSULTATION:  11/17/2006  DATE OF DISCHARGE:                                 CONSULTATION   NEUROLOGY CONSULT   HISTORY OF PRESENT ILLNESS:  [name] is a 70 year old right-  handed black male born November 17, 1941 with a history of diabetes,  hypertension, coronary artery disease.  This patient was in relatively  good health until the day of admission.  The patient was with his  fiancee when he was noted to be  staring off looking at the TV.  When  asked questions, this patient would respond verbally but was not acting  normally.  The patient has no recollection of any events such as this  and as far as he knows was feeling fine at home and woke up in the  emergency room.  The patient has no recollection of the above  conversation with his fiancee and no recollection of being transported  to the hospital by EMS.  The patient did not jerk or twitch or bite his  tongue or lose control of the bowels or bladder.  The patient no  headache before, during or after the event.  The patient underwent a CT  scan of the brain that revealed some evidence of left frontal and left  ethmoid sinus disease and encephalomalacia involving the left frontal  lobe.  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  The patient had evidence of breech of the  posterior wall of the left frontal sinus with subsequent  encephalomalacia of the left frontal lobe associated with this.  EEG  study done during this hospitalization was normal.  Neurology is  consulted for  evaluation of whether not to treat the patient for  seizures.  Neurosurgery and ear, nose and throat physicians have  followed this patient during this hospitalization.   PAST MEDICAL HISTORY:  Is significant for:  1. New onset seizure event as above  2. Diabetes.  3. Hypertension  4. Hypercholesterolemia  5. History  of acute renal failure during this hospitalization.  This      patient has had elevation in BUN, creatinine to the 90 over 2.454      range.  6. History of coronary artery disease status post CABG procedure 1991.  7. Left frontal sinus disease as above.  8. Cataract surgery  9. Right ankle fracture with subsequent surgery  10.Obesity  11.Degenerative arthritis.   ALLERGIES:  Patient has an allergy to CODEINE   Does not currently smoke or drink.   MEDICATIONS:  1. Norvasc 10 mg daily  2. Aspirin 325 mg daily  3. Lipitor or 40 mg daily  4. Cipro 400 mg  q.12 h  5. The patient is on some Lovenox  6. Sliding scale insulin, is on NovoLog insulin 70/30 3 units subcu      a.c. and h.s.  7. Protonix 40 mg daily  8. Tylenol if needed   SOCIAL HISTORY:  The patient is not married, has a Industrial/product designer,  lives in  the [place] area, is retired.  He has four children who are  relatively good health.   FAMILY MEDICAL HISTORY:  Mother died with heart disease, hypertension.  Father died with heart disease and hypertension.  Patient has three  brothers and one sister living.  One brother has heart disease.  One  sister had diabetes.  Two brothers and one sister had passed away, one  with alcoholism, one following a motor vehicle accident, one sister died  with cancer.   REVIEW OF SYSTEMS:  Notable for no recent fevers.  The patient does  report some recent chills, however, over the last couple of days.  Denies headache, does have occasional neck pain, denies shortness of  breath, chest pains, abdominal pains, troubles controlling bowels or  bladder.  Denies any focal numbness  or weakness on the face, arms, legs,  gait disturbance, dizziness and denies any previous blackout episodes or  periods of lost time.   PHYSICAL EXAMINATION:  VITALS:  Blood pressure is 140/74, heart rate 67,  respiratory 20, temperature is 99.1.  IN GENERAL this patient is a minimally to moderately obese black male  who is alert, cooperative at time examination.  HEENT:  Head is atraumatic.  EYES:  Pupils equal, round and reactive to  light.  Disks are flat bilaterally.  NECK is supple.  No carotid bruits noted.  RESPIRATORY EXAMINATION is clear.  CARDIOVASCULAR:  Reveals a regular rate and rhythm.  No obvious murmurs  or rubs noted.  EXTREMITIES:  Reveal trace edema at the ankles.   NEUROLOGIC EXAMINATION:  CRANIAL NERVES:  As above.  Facial symmetry is  present.  Patient has good sensation of face to pinprick, soft touch  bilaterally.  Has good strength of facial muscles, muscles of the head  turn, shoulder shrug bilaterally.  Speech is well enunciated, not  aphasic.  Motor testing shows 05/05 strength in all fours.  Good  symmetric motor is noted throughout.  Sensory testing is intact to  pinprick, soft touch, vibratory sensation throughout.  Patient has fair  finger-nose-finger, heel-to-shin.  Gait was not tested.  No drift is  seen.  Deep tendon reflexes depressed but symmetric.  Toes neutral  bilaterally.  Again extraocular movements are full, visual fields are  full.   LABORATORY VALUES:  Notable for sodium 137, potassium 3.8, chloride 108,  CO2 22, glucose of 101, BUN of 56, creatinine 1.92, calcium 8.9, white  count of 5.5, hemoglobin 10.0, hematocrit of 29.0, platelets of 213.  Hemoglobin A1c of 5.3, TSH of 0.801, B12 level 793, homocystine level  24.   EEG was done as normal.  CT and MRI of the head is as above.   IMPRESSION:  1. Probable seizure event with partial complex features  2. Left frontal encephalomalacia associated with left frontal sinus      disease   3. Diabetes  4. Coronary artery disease,  5. Hypertension.   This patient appears to have had an event that is very consistent with a  seizure, with sudden onset of altered consciousness, staring off,  unusual behavior.  The patient has no recollection for events for 15-20  minutes  around the time of the event.  EEG was normal but the event is  most consistent with a seizure episode.  Given the encephalomalacia seen  by MRI and CT in the left frontal area and the clinical event consistent  with seizure, recommend treatment for seizures.  Individuals with focal  brain abnormalities and clinical seizures have very high risk for  recurring seizures without treatment.   PLAN:  1. Will initiate Dilantin therapy 300 mg at night  2. Check blood levels in 2 weeks  3. Follow up in [facility] in  6-8 months.  Will      follow clinical course off and on during this hospitalization.  It      is not clear if this patient will receive surgery for the left      frontal sinus problem      C. Lesia Sago, M.D.  Electronically Signed     CKW/MEDQ  D:  11/17/2006  T:  11/18/2006  Job:  [id number]   cc:   [facility]  Sean A. Everardo All, MD

## 2010-08-02 NOTE — Assessment & Plan Note (Signed)
[facility]                          CHRONIC HEART FAILURE NOTE   NAME:OLIVERFlorian, [name]                       MRN:          [id number]  DATE:02/18/2007                            DOB:          09/15/1940    PRIMARY CARDIOLOGIST:  Dr. Rollene Rotunda.   PRIMARY CARE PHYSICIAN:  Dr. Romero Belling.   [name] is new to the heart failure clinic.  He is a very pleasant 70-  year-old African-American gentleman with known history of congestive  heart failure, secondary to ischemic cardiomyopathy, with EF currently  36% by stress Myoview in October of this year.  [name] was  hospitalized in October of this year, secondary to abnormal stress  Myoview.  He underwent a cardiac catheterization at that time, with  recommendations for medical management for the patient.  [name]  followed up with Dr. Antoine Poche later in October.  His Carvedilol was  increased to 31.25 mg b.i.d., with a target dose of 50 mg b.i.d.  Renal  insufficiency was stable at that time.  The patient was scheduled for  followup in the heart failure clinic, for further titration of his  medication.  [name] states he has been doing well.  He did have an  appointment with me last month but unfortunately had to cancel that and  reschedule.  He lives in [place] alone.  He does have a fiancee who  helps keep him on track, per the patient's report.  She is very  cognizant of his sodium intake and cooking habits.  [name] ambulates  with the assistance of a cane when he is out and about at home.  He  states he gets alone fine without any devices.  He denies any symptoms  suggestive of volume overload, orthopnea, PND.  He does not use any  tobacco products or alcohol substances or recreational substances.  He  stays compliant with his medications.  He did see Dr. Kathrene Bongo  recently.  She follows him for his chronic renal insufficiency.  He was  told that his creatinine was stable at  1.5, per patient's report, and  his Lasix was increased to 120 mg b.i.d.  He has been taking this dose  for approximately one week.  Weight at home has been stable, 252 to 254.  He denies any episodes of chest discomfort either.   PAST MEDICAL HISTORY:  1. Congestive heart failure secondary to ischemic cardiomyopathy with      EF currently 36%.  2. Coronary artery disease status post remote MI and CABG in 1991.  3. Status post recent cardiac catheterization in October of this year,      secondary to an abnormal stress perfusion study.  The cardiac      catheterization demonstrated the left main was normal.  LAD was      occluded proximally.  First diagonal had subtotal stenosis.  Second      and third diagonals were occluded at the ostium.  Circumflex      included an 80% stenosis in the ramus intermediate.  AV groove had  proximal 75% stenosis and subtotal stenosis before the PDA.  PDA      had subtotal stenosis via the LAD.  The right coronary is      nondominant and occluded proximally.  Saphenous vein graft to the      OM1 and OM2 were occluded at the ostium.  Saphenous vein graft to      the diagonal 1 and diagonal 2 and diagonal 3 was occluded at the      ostium.  LIMA to the LAD was slightly patent and filled the large      LAD which wrapped the apex and fed collaterals.  It was decided to      use medical management for the patient.  4. Chronic renal insufficiency followed by Dr. Kathrene Bongo.  5. Diabetes followed by Dr. Everardo All.  6. Hypertension.  7. Hyperlipidemia.  8. Seizure disorder requiring Dilantin therapy.  9. Osteoarthritis.  10.Degenerative joint disease.  11.Gout.  12.Anemia.  13.History of rhabdomyolysis.   REVIEW OF SYSTEMS:  As stated above.   CURRENT MEDICATIONS:  Include:  1. Lantus and NovoLog insulin as directed.  2. Klor-Con 20 mEq b.i.d.  3. Carvedilol:  The patient is taking 25 mg b.i.d. plus 6.25 mg      b.i.d.( = 31.25mg  BID)  4. Dilantin  100 mg 3 tablets q.h.s.  5. Amlodipine 10 mg daily.  6. Lipitor 40 mg daily.  7. Aspirin 325 daily.  8. Furosemide 120 mg b.i.d.  9. Enalapril.  There was apparently some confusion about the      Enalapril, but when the patient was discharged home from [facility]      in October, he was discharged on 5 mg p.o. b.i.d.  Chart states he      is taking 10 mg b.i.d.  The patient states this is not true.   PHYSICAL EXAMINATION:  VITAL SIGNS:  Weight 254 pounds with coat on,  blood pressure 142/72 with a heart rate of 68.  [name] is a very  pleasant gentleman.  GENERAL:  He is in no acute distress.  HEENT:  Unremarkable.  NECK:  Supple without lymphadenopathy.  No bruits, no JVD.  LUNGS:  Bilateral lower lobes.  He has some scattered rhonchi in the  upper lobes.  CARDIOVASCULAR:  Reveals S1 and S2, regular rate and rhythm.  ABDOMEN:  Obese, soft, nontender, positive bowel sounds.  SKIN:  Warm and dry.  LOWER EXTREMITIES:  The patient has mild bilateral lower extremity edema  in the ankle area, with the right being greater than the left, non-  pitting.  NEUROLOGIC:  Alert and oriented x3.   IMPRESSION:  Congestive heart failure secondary to ischemic  cardiomyopathy.  Will continue current medications.  In regards to his  Carvedilol, I am going to increase him to 37.5 mg b.i.d., which will be  a 25-mg tablet, plus a 12.5-mg tablet twice a day.  A new prescription  has been  sent in for him to his pharmacy.  Will plan on repeating lab work today,  as his Lasix dose was increased last week by Dr. Kathrene Bongo.      Dorian Pod, ACNP  Electronically Signed      [name], MD  Electronically Signed   MB/MedQ  DD: 02/18/2007  DT: 02/18/2007  Job #: [id number]   cc:   Cecille Aver, M.D.

## 2010-08-02 NOTE — Consult Note (Signed)
NAME:  [name], [name] NO.:  [id number]   MEDICAL RECORD NO.:  [id number]          PATIENT TYPE:  INP   LOCATION:  5707                         FACILITY:  [facility]   PHYSICIAN:  Kinnie Scales. Annalee Genta, M.D.DATE OF BIRTH:  11/17/1941   DATE OF CONSULTATION:  DATE OF DISCHARGE:                                 CONSULTATION   SUBJECTIVE:  [name] is a 70 year old Black male, who presents to the  [facility] Emergency Department with acute mental changes, loss  of consciousness and possible syncope.  The patient had a prior  __________  history of infrequent seizures and was brought by EMS to the  emergency department for evaluation with possible ongoing seizure  activity.  He had a past medical history of hypertension and insulin  dependent diabetes, which by the patient's report, were stable.  In  evaluating the patient in the emergency room, a CT scan of the brain was  performed.  Of note, the patient was found to have opacification of the  left frontal sinus and concerns were raised regarding a sinus condition  possibly causing mental status changes and infection.  At the time of  his consultation, patient was awake and alert and denied any prior sinus  history, particularly denied any frontal or facial headaches, no nasal  discharge or pain, no history of prior infections or allergies, and no  trauma.  Radiology imaging was reviewed.  Limited CT scan of the head  showed opacification of the left frontal sinus with bony changes  consistent with possible ostitis versus obstructing osteoma with  possible erosion of the posterior table of the frontal sinus.  MRI scan  without gadolinium showed the above sinus changes consistent with  chronic sinusitis involving the left frontal region.  There is a  possible area of concern and the radiologist, Dr. Constance Goltz, raised the  possibility of an encephalocele versus frontal sinus with bone erosion.   EXAMINATION:  [name]  is a 70 year old Black male, alert, oriented  for self, place and time in no acute distress.  He denies any headache  or pain.  Ears show normal external auditory canals and tympanic membranes.  Nasal  cavity is patent with a mild septal deviation.  No mass or polyp.  No  purulent discharge.  No infection.  Oral cavity and oropharynx:  Poor  dentition.  Normal oral mucosa.  No postnasal discharge.  No trismus.  No infection.  NECK:  No lymphadenopathy or mass.  Normal thyroid gland to palpation.  The patient was afebrile and had a  normal white blood cell count.   IMPRESSION:  1. Acute mental status changes versus possible surgery disorder.  2. Left frontal sinus opacification which may represent chronic      sinusitis versus mucocele with bone erosions versus obstructing      osteoma of the nasal frontal recess.  3. 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   ASSESSMENT AND PLAN:  [name] presented to the emergency department  with acute mental status changes, etiology unclear.  Incidental findings  included opacification of the left  frontal sinus, for which the patient  has no symptoms and no prior history.  Etiology of bone changes in that  area is unclear.  This might represent chronic bony dysfunction,  possible chronic infection versus possible encephalocele.  The patient  was started on intravenous ciprofloxacin antibiotics, which is a  reasonable choice and I have recommended that he continue on this  medication.  We will monitor symptoms.   After discussing the case with radiologist, I would recommend a fine cut  axial CT scan with stealth formatting to be used to further evaluate the  frontal sinus and anterior cranial fossa.  This will be scheduled for  later this evening.   We will follow the patient and monitor CT findings.  The patient is  stable and asymptomatic and we may consider further consideration or  evaluation as an outpatient.  We may also  consider consultation of the  neurosurgical service for evaluation of possible encephalocele.  The  patient may be a candidate for sinus surgery and possible drainage or  biopsy in the future.  [name] is comfortable with the plan as  outlined above and we will obtain the CT scan as soon as possible.  This  is discussed with his admitting physician, Dr. Christella Noa, who concurs  with this plan.           ______________________________  Kinnie Scales. Annalee Genta, M.D.     DLS/MEDQ  D:  91/47/8295  T:  11/15/2006  Job:  [id number]

## 2010-08-02 NOTE — Cardiovascular Report (Signed)
NAME:  [name], [name] NO.:  [id number]   MEDICAL RECORD NO.:  [id number]          PATIENT TYPE:  INP   LOCATION:  3705                         FACILITY:  [facility]   PHYSICIAN:  Rollene Rotunda, MD, FACCDATE OF BIRTH:  11/17/1941   DATE OF PROCEDURE:  12/25/2006  DATE OF DISCHARGE:                            CARDIAC CATHETERIZATION   PROCEDURE:  Left heart catheterization/coronary arteriography/bypass  grafting angiogram/aortic root injection.   PROCEDURE NOTE:  Left heart catheterization performed via the right  femoral artery. The artery was cannulated using anterior wall puncture.  He needed 3 cannulations.  Each time I was able to cannulate the vessel  easily but not able to feed the wire.  I eventually used a wooly wire.  All catheter exchanges were made over the wire without any  complications.  Preformed Judkins, pigtail, multipurpose, left bypass  graft and LIMA catheters were used.  The patient tolerated the procedure  well and left the lab in stable condition.   RESULTS:  HEMODYNAMICS:  LV 152/12, AO 147/71.  CORONARIES:  The left main was normal.  The LAD was occluded proximally.  The first diagonal was moderate-sized with subtotal stenosis.  The  second and third diagonals were apparently occluded at the ostium and  were not visualized.  There were known to be present based on the  description of the bypass.  CIRCUMFLEX:  The circumflex appeared to be the dominant vessel.  There  was a large ramus intermediate with a long mid 80% stenosis.  The AV  groove had diffuse disease.  There was proximal 75% stenosis.  There was  a distal subtotal stenosis before posterolateral and a PDA.  The PDA was  not seen to fill adequately as it had either subtotal or total stenosis.  There was some evidence of the PDA filling via the LAD.  The right  coronary appeared to be nondominant and was occluded proximally.   GRAFTS:  The saphenous vein graft to OM-1 and OM-2 was  occluded at the  ostium.  The saphenous vein graft to diagonal one and diagonal two and  diagonal three was occluded at the ostium.  A LIMA to the LAD was widely  patent and filled the large LAD which wrapped the apex and fed some  collaterals to the PDA circulation as well as scant collaterals to  diagonal vessels.  AO:  An aortic root shot was obtained and confirmed that the saphenous  vein grafts were occluded.  There was no aortic root disease.  LEFT VENTRICLE:  The left ventricle was crossed for pressures but not  injected secondary to renal insufficiency.   CONCLUSION:  Severe native three-vessel coronary artery disease with  diffuse disease as described.  Two of three grafts occluded.   PLAN:  Based on the diffuse nature of the disease and the absence of  active symptoms (essentially the patient had a non-Q-wave myocardial  infarction and an abnormal Cardiolite), the patient will be managed  medically and aggressively for his ischemic cardiomyopathy and for  secondary risk reduction.  We could consider revascularization of the  circumflex  if he has symptoms indicating the need for this going  forward.      Rollene Rotunda, MD, [facility]  Electronically Signed     JH/MEDQ  D:  12/25/2006  T:  12/25/2006  Job:  [id number]   cc:   Gregary Signs A. Everardo All, MD

## 2010-08-02 NOTE — Assessment & Plan Note (Signed)
[facility]                          CHRONIC HEART FAILURE NOTE   NAME:[name], [name]                       MRN:          [id number]  DATE:03/25/2007                            DOB:          Feb 29, 1941    REASON FOR VISIT:  Mr. Lober returns today for followup of his  congestive heart failure which is secondary to ischemic cardiomyopathy  with EF currently 36%. I initially saw Mr. Hirota as a new patient back  in December.  He was referred here for ongoing medication titration.  At  that time, I had increased his carvedilol to 37.5 mg b.i.d.  He states  he made the adjustments in his medication and has tolerated it without  lightheadedness, dizziness, presyncope or syncopal episodes.  He states  that when he checks his blood pressure at home it averages 130/60s,  however, when he goes to the doctor's office it is always higher.  He  has been compliant with his no added salt diet.  His fiance continues to  cook for him.  He states she has an iron fist when it comes to allowing  him any salt or fast food.  He states compliance with his medications.  He is followed by Dr. Kathrene Bongo for his chronic renal insufficiency.  Overall, he states he has been doing well without any new problems.   PAST MEDICAL HISTORY:  1. Congestive heart failure secondary to ischemic cardiomyopathy with      EF currently 36%.  2. Coronary artery disease, status post remote MI and CABG in 1991.  3. Status post cardiac catheterization in October 2008, with      recommendations for medical management.  4. Chronic renal insufficiency followed by Dr. Kathrene Bongo.  5. Diabetes followed by Dr. Everardo All.  6. Hypertension.  7. Hyperlipidemia.  8. Seizure disorder requiring Dilantin therapy.  9. Osteoarthritis.  10.Degenerative joint disease.  11.Gout.  12.Chronic anemia.   REVIEW OF SYSTEMS:  As stated above.   CURRENT MEDICATIONS:  1. Lantus and NovoLog insulin as  directed.  2. Klor-Con 20 mEq b.i.d.  3. Carvedilol 37.5 mg b.i.d.  4. Dilantin 100 mg three tablets nightly.  5. Amlodipine 10 mg daily.  6. Lipitor 40 mg daily.  7. Aspirin 325 daily.  8. Enalapril 5 mg b.i.d.  9. Furosemide 120 mg b.i.d.   PHYSICAL EXAMINATION:  VITAL SIGNS:  Weight 253 pounds, blood pressure  159/79 with heart rate of 59.  GENERAL:  Mr. Minnehan is in no acute distress.  NECK:  Supple without lymphadenopathy.  No bruits and JVD.  LUNGS:  Clear to auscultation bilaterally.  ABDOMEN:  Soft, nontender, positive bowel sounds.  CARDIOVASCULAR:  S1, S2, regular rate and rhythm.  EXTREMITIES:  The patient has +1, nonpitting lower extremity edema in  the ankle area with the right being greater than the left which he  states is chronic for him.  NEUROLOGICAL:  Alert and oriented x3.   IMPRESSION:  Congestive heart failure secondary to ischemic  cardiomyopathy, currently Class II symptoms.   PLAN:  Will continue current medications with the exception of his  carvedilol.  I am going to increase this to 50 mg b.i.d.  New  prescription has been sent to his pharmacy.  He is to take 25 mg, two  tablets twice a day.  I will see him back in 1 month for re-evaluation.      Dorian Pod, ACNP  Electronically Signed      Rollene Rotunda, MD, [facility]  Electronically Signed   MB/MedQ  DD: 03/25/2007  DT: 03/25/2007  Job #: [id number]

## 2010-08-02 NOTE — H&P (Signed)
NAME:  [name], CISEK NO.:  [id number]   MEDICAL RECORD NO.:  [id number]          PATIENT TYPE:  EMS   LOCATION:  MAJO                         FACILITY:  [facility]   PHYSICIAN:  Hillery Aldo, M.D.   DATE OF BIRTH:  11/17/1941   DATE OF ADMISSION:  11/15/2006  DATE OF DISCHARGE:                              HISTORY & PHYSICAL   PRIMARY CARE PHYSICIAN:  Dr. Audria Nine at [facility].   CHIEF COMPLAINT:  Altered mental status.   HISTORY OF PRESENT ILLNESS:  The patient is a 70 year old male who was  brought to the hospital via EMS for possible seizure activity  accompanied by altered mental status.  The patient has no recollection  of the events prior to his coming here.  The last thing he remembers is  watching television.  He denies any headache.  He denies fever.  He has  had some intermittent chills.  He has had some abdominal pain with  emesis and headache.  Denies any focal weakness.  No history of trauma.  He has a remote history of a seizure in the past, but cannot provide any  details regarding any workup for etiology of his seizure.  He is not  currently on any antiseizure medications.  Upon initial evaluation in  the emergency department, he is found to be dehydrated with acute renal  failure and evidence of a urinary tract infection.  He has some  abnormalities on CT and MRI scanning with sinusitis that may have eroded  through the left frontal sinus area.  This could be a focus of seizure  activity.  We are therefore admitting him for further evaluation and  workup.   PAST MEDICAL HISTORY:  1. Diabetes.  2. History of seizure x1 in the past.  3. Coronary artery disease, status post coronary artery bypass      grafting in 1991.  4. Hypertension.  5. Osteoarthritis.  6. Dyslipidemia.  7. Status post right ankle fracture, status post repair.  8. Charcot's joint  9. Cataract surgery.   FAMILY HISTORY:  The patient's mother died at 9 from congestive  heart  failure; she had hypertension.  The patient's father died at 25 from  congestive heart failure.  He has 4 living siblings.  He has 2 brothers  who are deceased.  One died of alcoholism and the other died in an MVA.  He has 1 sister who is deceased from colon cancer.   SOCIAL HISTORY:  The patient is divorced and lives alone.  He quit  smoking in 1991.  He denies alcohol or drug use.  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   ALLERGIES:  CODEINE causes nausea and vomiting.   CURRENT MEDICATIONS:  1. Cyclobenzaprine 10 mg p.r.n.  2. Darvocet-N 100 one tablet q.6 h. p.r.n.  3. Aspirin 325 mg daily.  4. Enalapril 10 mg daily.  5. Furosemide 40 mg b.i.d.  6. Labetalol 80 mg b.i.d.  7. Lipitor 40 mg daily.  8. Norvasc 10 mg daily.  9. Zaroxolyn 5 mg b.i.d.  10.Zoloft 50 mg daily (the patient takes only  sporadically).   REVIEW OF SYSTEMS:  The patient denies any appetite changes, or weight  loss, except for an intentional weight loss for management of his  diabetes.  He denies shortness of breath or cough.  He denies chest  pain.  He denies any changes in his bowel habits.  He denies blood in  the stool.   PHYSICAL EXAM:  VITAL SIGNS:  Temperature 97.2, pulse 71, respirations  18, blood pressure 118/61, O2 saturation 97% on room air.  GENERAL:  This is a well-developed, well-nourished male in no acute  distress.  HEENT:  Normocephalic, atraumatic.  PERRL with mild anisocoria with the  right pupil being slightly larger than the left and changes of cataract  surgery noted.  Extraocular movements are intact.  Oropharynx reveals  poor dentition.  NECK:  Supple, no thyromegaly, no lymphadenopathy, no jugular venous  distention.  CHEST:  Decreased breath sounds, but clear bilaterally.  HEART:  Regular rate, rhythm.  No murmurs, rubs or gallops.  ABDOMEN:  Soft, nontender, nondistended with normoactive bowel sounds.  EXTREMITIES:  There is venous stasis dermatitis and 1+ edema   bilaterally.  SKIN:  Warm and dry.  No rashes.  NEUROLOGIC:  The patient is alert and oriented x3.  Cranial nerves II-  XII are grossly intact.  He moves all extremities x4 with equal  strength.  Nonfocal.   DATA REVIEW:  CT scan of the head shows probable subdural  hygromas/fluids, left greater than right, with questionable  edema/hematoma, right forehead.   MRI scan of the brain shows prominent CSF spaces with atrophy.  Negative  for acute infarct.  Abnormal anterior left frontal lobe, questionable  encephalocele causing obstruction of drainage of left frontal sinus.   LABORATORY DATA:  White blood cell count is 6.4, hemoglobin 10.4,  hematocrit 30.5, platelets 220,000.  Sodium is 138, potassium 4.3,  chloride 106, bicarb 21, BUN 90, creatinine 2.45, glucose 194.  Lipase  was 39.  Liver function studies were within normal limits.  Urine drug  screen and alcohol screen were negative.  Urinalysis reveals positive  nitrites and a large amount of leukocyte esterase.  There are too  numerous to count white blood cells, 0-2 red blood cells and many  bacteria.  Coagulation studies are normal.   ASSESSMENT AND PLAN:  1. Altered mental status with questionable seizure:  The patient has      absolutely no recollection of the events, so it is difficult to      ascertain the exact nature of his altered mental status.      Nevertheless, we will obtain an EEG to rule out a seizure focus.      The patient does have abnormalities on CT and MRI scanning that      could represent of possible seizure focus.  At this point, I think      it is premature put him on antiepileptic medications.  We will      monitor him closely on the neurologic unit and work him up more      fully for possible cause of his altered mental status.  2. Dehydration with acute renal failure:  The patient has an elevated      BUN and creatinine suggestive of acute renal failure.  He has been      on an ACE inhibitor and a  diuretic, which we will hold.  We will      administer gentle intravenous fluids and monitor his renal function  for recovery.  3. Urinary tract infection:  The patient has a urinary tract      infection.  We will culture his urine and empirically start him on      renal-dose Cipro.  4. Sinusitis:  The patient's sinusitis will likely need further      evaluation by an ear, nose and throat specialist, given the      abnormalities seen on CT and MRI scanning.  5. Microcytic anemia:  The patient's macrocytic anemia might be due to      a B12 or folate deficiency.  We will check an anemia panel to      evaluate this more fully.  6. Diabetes:  Patient is not currently on any medications for his      diabetes.  He has been on Avandia in the past, but discontinued use      of this because of concerns regarding side-effects.  We will      monitor his blood glucoses and initiate treatment if indicated.      The patient states that he has lost weight intentionally and since      he has done so, he has not had as many problems with controlling      his blood sugars.  7. Coronary artery disease:  The patient will be continued on his      usual dose of aspirin.  He is asymptomatic.  8. Hypertension:  The patient is not currently hypertensive.  We will      monitor his blood pressure closely and continue his Norvasc and      labetalol if his blood pressure tolerates it.  9. Dyslipidemia:  We will continue the patient's Lipitor.  We will      check a fasting lipid profile ensure that he is adequately treated.  10.Prophylaxis:  Initiate gastrointestinal prophylaxis with Protonix      and deep venous thrombosis prophylaxis with Lovenox.      Hillery Aldo, M.D.  Electronically Signed     CR/MEDQ  D:  11/15/2006  T:  11/16/2006  Job:  [id number]   cc:   Maurice March, M.D.

## 2010-08-02 NOTE — Assessment & Plan Note (Signed)
[facility]                          CHRONIC HEART FAILURE NOTE   NAME:OLIVERYousaf, Sainato                       MRN:          [id number]  DATE:05/01/2007                            DOB:          January 04, 1941    PRIMARY CARDIOLOGIST:  Rollene Rotunda, MD, [facility]   PRIMARY CARE PHYSICIAN:  Cleophas Dunker. Everardo All, MD   NEPHROLOGY:  Cecille Aver, M.D.   Mr. Todt returns today for followup of his congestive heart failure  which is secondary to ischemic cardiomyopathy with EF currently 30%. I  last saw Mr. Muzquiz back in January at which time I increased his  carvedilol to 50 mg b.i.d. which is goal dose for him.  He states he has  tolerated this dose without problems. His only complaint is of recent  knee pain.  He had a right knee effusion and had fluid drained off back  on February 2nd at [facility]. He states this was excruciating  pain.  He was unable to walk and  has resolved since then and feels back  to baseline.  He states he has an appoint with Dr. Kathrene Bongo next  week for further evaluation of his kidney disease. He ambulates with  assistance of a cane. States his blood sugars have been running between  98 and 115 at home. Weight has remained stable at home 246-250. The  patient reports compliance with diet restrictions and medications.  Overall, states he is doing okay at this time.   PAST MEDICAL HISTORY:  1. Congestive heart failure secondary to ischemic cardiomyopathy with      EF currently 36%.  2. Coronary artery disease status post myocardial infarction and      bypass in 1991.  Most recent catheterization in October 2008 with      recommendations for medical management.  3. Chronic renal insufficiency followed by Dr. cold oral  4. Diabetes followed by Dr. Everardo All  5. Hypertension.  6. Hyperlipidemia.  7. Seizure disorder requiring Dilantin therapy.  8. Osteoarthritis/degenerative joint disease.  9. Gout.  10.Chronic  anemia.   REVIEW OF SYSTEMS:  As stated above.   CURRENT MEDICATIONS:  1. Include Lantus and NovoLog insulin as directed.  2. Klor-Con 20 mEq b.i.d.  3. Carvedilol 50 mg b.i.d.  4. Furosemide 120 mg b.i.d.  5. Enalapril 5 mg b.i.d.  6. Aspirin 325.  7. Lipitor 40.  8. Amlodipine 10.  9. Dilantin 100 mg 3 tablets nightly.   REVIEW OF SYSTEMS:  As stated above, otherwise negative.   PHYSICAL EXAM:  Weight 251, blood pressure 141/78 with a heart rate of  65.  Mr. Putt is in no acute distress.  No signs of jugular vein distention at 45 degrees angle.  LUNGS:  Are clear to auscultation bilaterally.  CARDIOVASCULAR: Reveals S1-S2, regular rate and rhythm.  LOWER EXTREMITIES:  He has +1 pitting edema with the right lower  extremity being greater than the left.  NEUROLOGICAL:  Alert and oriented x3.   IMPRESSION:  Congestive heart failure secondary to ischemic  cardiomyopathy with ejection fraction 36% with signs of mild volume  overload.  The patient pending followup with Dr. Kathrene Bongo regarding  kidney function. I am going to go ahead and schedule him for a repeat 2-  D echocardiogram as he is on goal therapy in regards to his beta blocker  at this time.  Will also have him follow up with Dr. Antoine Poche for  routine cardiology visit. Note, will not check blood work at this time  as I suspect Dr. Kathrene Bongo will be checking blood work.      Dorian Pod, ACNP  Electronically Signed      Rollene Rotunda, MD, [facility]  Electronically Signed   MB/MedQ  DD: 05/01/2007  DT: 05/02/2007  Job #: [id number]   cc:   Cecille Aver, M.D.

## 2010-08-02 NOTE — Discharge Summary (Signed)
NAME:  [name], [name] NO.:  [id number]   MEDICAL RECORD NO.:  [id number]          PATIENT TYPE:  INP   LOCATION:  5707                         FACILITY:  [facility]   PHYSICIAN:  Herbie Saxon, MDDATE OF BIRTH:  11/17/1941   DATE OF ADMISSION:  11/14/2006  DATE OF DISCHARGE:                               DISCHARGE SUMMARY   INTERIM DISCHARGE SUMMARY   DATE OF DISCHARGE:  To be determined.   CONSULTATIONS:  Dr. Newell Coral,  Neurosurgery.  Dr. Anne Hahn, Neurology.  Dr. Jeananne Rama, ENT.   RADIOLOGY:  The CT maxillofacial of November 15, 2006 shows changes of  chronic sinusitis of the left maxillary sinus, encephalomalacia of the  inferior left frontal lobe, opacification and expansion of the  frontal  sinus, no definite mass lesions, breech of the posterior wall of the  left frontal sinus.  CT head of November 14, 2006 shows soft tissue  swelling overlying the right forehead, sinus disease involving the left  frontal  and ethmoidal regions, focus of encephalomalacia left frontal  lobe.  MRI brain of November 15, 2006 shows an abnormality in the left  frontal lobe adjacent to left frontal sinus and anterior ethmoid sinus.  No acute infarct, atrophy and mild nonspecific white matter changes.   HOSPITAL COURSE:  This is a 70 year old African American male with a  history of diabetes, hypertension, coronary artery disease, morbid  obesity, chronic kidney disease, coronary artery disease status post  CABG in 1991, as well as  history of arthritis who presented to the  emergency room on November 15, 2006 with altered mental status, he was not  acting normally, as per family members.  He also had amnesia but there  was no observed seizure activity.  The CT of the brain was abnormal  necessitating ENT and Neurosurgery evaluation.  Dr. Jeananne Rama was  initially thinking a possible left frontal sinus surgery as outpatient  but the neurosurgeon, Dr. Newell Coral, was of the opinion  that surgery  should be avoided as much as possible.  The neurologist, Dr. Anne Hahn,  started the patient on Dilantin therapy and we will check his blood  Dilantin level in 2 weeks and follow up with neurologist in 6 months to  monitor the seizure activity. The patient's renal function has remained  stable and he has been restarted gently on his diuretics as noted to  have pedal swelling today and mild shortness of breath last night.  He  also was noted to have a urinary tract infection.  The urine cultures  grew Staphylococcus species sensitive to  nafcillin.   DISPOSITION:  November 21, 2006 if patient remains clinically stable.  Medications to be detailed on discharge   On examination today, he is an elderly man not in acute distress.  Temperature 98, pulse 70, respiratory rate is 20, blood pressure 125/68.  Clinically pale, heart sounds 1 and 2.  Decreased breath sounds at the  bases.  Neurologically he is alert and oriented x3.  ABDOMEN: Benign.  Peripheral pulses reduced and plus pedal edema.  No asterixis.   LABS:  Glucose of 113,  BUN 44, creatinine 2, sodium 138, potassium 4.7,  chloride 108, bicarbonate 17.  The WBC is 10.2, hematocrit 25.9,  platelet count is 183,000.      Herbie Saxon, MD  Electronically Signed     MIO/MEDQ  D:  11/20/2006  T:  11/20/2006  Job:  [id number]

## 2010-08-02 NOTE — Procedures (Signed)
EEG NUMBER:  [id number]   This is a 70 year old man with a history of seizures, admitted after  not acting right and confused.  Medication listed include morphine,  NovoLog, Lovenox, aspirin, Protonix, Vasotec, Lasix, Zaroxolyn,  Normodyne, Lipitor, Norvasc, Phenergan, Zofran, and Tylenol.  This is a  portable 17 channel EEG with 1 channel devoted to EKG, utilizing the  [employer] 10/20 lead placement system.  The patient was described  clinically as being awake and drowsy. His conscious state could not be  derived electrographically from the record.  The background consists of  a very low amplitude, poorly organized alpha activity of about 9 Hz,  which is predominate in the posterior head regions.  No interhemispheric  asymmetry is identified.  No definite epileptiform discharges are seen.  There is quite a bit of muscle artifact which worsens when increasing  the sensitivity in order to get a better idea of the background thus  obscuring the background even more.  The EKG monitor reveals a somewhat  irregular rhythm between 54 and 66.   CONCLUSION:  Abnormal EEG demonstrating very low amplitude, poorly  organized alpha activity.  This is nonspecific.  No focal abnormalities  were seen and no seizure discharges were identified. Clinical  correlation is recommended.      Catherine A. Orlin Hilding, M.D.  Electronically Signed     UEA:VWUJ  D:  11/16/2006 21:20:51  T:  11/18/2006 09:04:16  Job #:  [id number]

## 2010-08-02 NOTE — Consult Note (Signed)
NAME:  [name], [name] NO.:  [id number]   MEDICAL RECORD NO.:  [id number]          PATIENT TYPE:  INP   LOCATION:  5707                         FACILITY:  [facility]   PHYSICIAN:  Gerrit Friends. Dietrich Pates, MD, FACCDATE OF BIRTH:  11/17/1941   DATE OF CONSULTATION:  11/20/2006  DATE OF DISCHARGE:                                 CONSULTATION   PRIMARY CARE PHYSICIAN:  [facility]   CHIEF COMPLAINT:  Elevated cardiac enzymes and shortness of breath.   HISTORY OF PRESENT ILLNESS:  The patient is a 70 year old male with a  history of coronary artery disease.  He was admitted on November 15, 2006  for altered mental status possibly felt secondary to seizures and was  seen by neuro.  He also had worsening renal insufficiency with a BUN of  90 and creatinine of 2.5 on admission.  His ACE inhibitor and diuretics  were held and he was hydrated.  His BUN and creatinine improved to  44/2.0 but he has symptoms volume overload.  Additionally, cardiac  enzymes were drawn and were abnormal, so cardiology was asked to  evaluate him.   The patient denies any chest pain.  He states that he feels that the  shortness of breath was fairly sudden in onset and started today, but he  does admit that his lower extremity edema has been worse in the last  several days.  He feels that his dry weight is about 250 pounds, but he  was weighed today and he weighed 283 pounds.  He does not know what his  weight was just before he came in the hospital.  He has not been  evaluated by a cardiologist since he last saw Dr. Daisy Floro and does not  ever recall having an echocardiogram.  He states that he had a stress  test at some point in the past, but states it was more than 5 years ago.   PAST MEDICAL HISTORY:  1. Status post aortocoronary bypass surgery in 1991, no further      details available.  2. Status post stress test greater than 5 years ago, reportedly okay.  3. Diabetes.  4. Hypertension.  5. Hyperlipidemia.  6. Obesity.  7. History of seizure x1 years ago.  8. Osteoarthritis.  9. Acute renal insufficiency.  10.Degenerative joint disease.   PAST SURGICAL HISTORY:  Status post cardiac catheterization and bypass  surgery, as well as bilateral cataract surgery, right ankle surgery  after an injury.   ALLERGIES:  CODEINE.   CURRENT MEDICATIONS:  1. Norvasc 10 mg a day.  2. Aspirin 325 mg a day.  3. Lipitor 40 mg a day.  4. Ceftin 500 mg b.i.d.  5. Lovenox 130 mg q.12 hours.  6. Lasix 40 mg IV given x1 and then to be followed by 40 mg p.o. daily  7. Sliding scale insulin.  8. 70/30 insulin 60 units a.m. abd 3 units p.m.  9. Labetalol 800 mg p.o. b.i.d.  10.Zaroxolyn 5 mg p.o. b.i.d., restarted today.  11.Nitroglycerin paste one-half inch q.6 hours.  12.Protonix 40 mg  a day.  13.Dilantin 300 mg nightly.   SOCIAL HISTORY:  Lives in [place] alone and is retired from [employer].  He quit tobacco in 1991 with approximately 20 pack year history and  denies alcohol or drug abuse.   FAMILY HISTORY:  His mother had a lot of problems with heart failure.  He believes that she also had coronary artery disease.  His father died  with heart failure and he has one sister without any history of CAD.   REVIEW OF SYSTEMS:  Review of systems is weight is increased.  He has  lower extremity edema.  He has shortness of breath, as well as dyspnea  on exertion and orthopnea but denies PND.  Denies palpitations.  Denies  any presyncope or claudication symptoms or cough, but he admits that he  can at this time hear himself wheeze.  He denies fevers or chills.  He  denies any reflux symptoms or abdominal pain, melena, or hematemesis.  He does have some arthralgias which are chronic.  Full 14 point review  of systems is otherwise negative.   PHYSICAL EXAMINATION:  VITAL SIGNS:  Temperature is 98, blood pressure  118/67, pulse 72, respiratory rate 22, O2 saturation 94%  on room air.  GENERAL:  He is a well-developed obese African American male who appears  short of breath.  HEENT:  His head is normocephalic and atraumatic with extraocular  movements intact. Sclera clear.  Dentition is poor.  Nares are without  discharge.  The oropharynx as well, is without erythema or exudate.  NECK:  Without lymphadenopathy or thyromegaly, but there is moderate  JVD.  CVA:  Heart is heart is regular rate and rhythm with an S1-S2 and S4,  but no significant murmur or rub is noted.  Distal pulses are decreased  in his lower extremities secondary to edema but are palpable bilateral  radially.  LUNGS: He has audible wheeze in the upper airway and decreased breath  sounds at the bases.  SKIN:  No rashes or lesions are noted.  ABDOMEN:  Soft and nontender with active bowel sounds.  EXTREMITIES:  He has 2+ bilateral lower extremity edema.  MUSCULOSKELETAL:  There is no joint deformity or effusions.  NEURO:  He is alert and oriented.  Cranial nerves II-XII grossly intact.  Strength 5/5.   IMAGING:  Chest x-ray Performed today showed cardiac enlargement and  interstitial edema suggesting CHF.  There is eventration of the left  hemidiaphragm and probable small effusions.  EKG is sinus rhythm at rate 71 with decreased pulses, as well as  borderline increase QTC syndrome and poor R-wave progression, as well as  a possible prior anterior septal MI.  His inferior T-waves are flattened  and no old is available for comparison.   LABORATORY DATA:  Laboratory values hemoglobin 8.9, hematocrit 25.9, WBC  7.2, platelets 183, sodium 138, potassium 4.7, chloride 108, CO2 17, BUN  44, creatinine 2.0, glucose 87, CK-MB 1663/13.8 with an index within  normal limits at 0.8 and troponin-I is elevated at 1.91.   IMPRESSION:  The patient was interviewed and examined by Dr. Dietrich Pates  and records were reviewed.  He is in congestive heart failure after  about 4 liters +I/O since admission.  He  has not seen a cardiologist in  several years.  The elevated CK-MB and troponin are probably secondary  to congestive heart failure and renal insufficiency, it is doubtful that  this is acute coronary syndrome with no other ischemic symptoms.  It is  appropriate to transfer him to telemetry.  We will recheck his enzymes  in the morning.  We will also order an echocardiogram and a TSH, as well  as a free T3.  He likely has left ventricular dysfunction, but ACE  inhibitor will be deferred for now.  Diuresis is appropriate and Lovenox  will be changed to DVT prophylaxis.  His BUN and creatinine will be  followed closely and will check a BNP in the morning as well.  We will  follow him while he is in the hospital and be happy to see him after  discharge.      Theodore Demark, PA-C      Gerrit Friends. Dietrich Pates, MD, [facility]  Electronically Signed    RB/MEDQ  D:  11/20/2006  T:  11/21/2006  Job:  [id number]   cc:   Maurice March, M.D.

## 2010-08-02 NOTE — Assessment & Plan Note (Signed)
[facility]                            CARDIOLOGY OFFICE NOTE   NAME:[name]                       MRN:          [id number]  DATE:12/12/2006                            DOB:          11/17/1941    The primary is Sean A. Everardo All, MD   REASON FOR PRESENTATION:  Evaluate patient with recent hospitalization  for renal insufficiency and altered mental status.  He has a history of  coronary disease.   HISTORY OF PRESENT ILLNESS:  The patient was hospitalized on August 28  with altered mental status.  Upon initial evaluation in the emergency  room he was found to be dehydrated.  He had renal insufficiency with a  creatinine of 2.45.  We were involved on consultation with elevated  cardiac enzymes and shortness of breath.  He subsequently had a stress  perfusion study prior to leaving the hospital.  This did demonstrate  moderate reversible defects affecting the anteroseptal wall from mid to  apical level and the lateral wall from mid to basal level.  The ejection  fraction was 36% with hypokinesis of the inferior wall and anterolateral  apex.  Of note, an echocardiogram done in the hospital suggested that  the EF was mildly decreased.  These were poor image quality, however.   The patient now returns for follow-up.  He was somewhat debilitated at  the time of discharge.  He is still walking with a walker and working  with physical therapy.  The etiology of his renal insufficiency was felt  to be acute on chronic with dehydration.  His mental status was felt to  be multifactorial and improved.  He is back to baseline.  Of note, he  did have rhabdomyolysis, which was felt to probably be related to a  seizure with statins, although it was not entirely clear.  It was  suggested that statins be avoided in the future.   The patient now says he is doing well.  He is not describing any chest  discomfort, neck or arm discomfort.  He has not been having  any  palpitations, no presyncope or syncope.  He has not been having any PND  or orthopnea.  He feels overall weak, however.   PAST MEDICAL HISTORY:  1. Coronary artery disease, status post CABG (remote MI in 1991).  I      do find a report of a vein graft to an OM-1 and OM-2.  He has had      previous PTCA of a vein graft.  2. Diabetes mellitus.  3. Hypertension.  4. Hyperlipidemia.  5. Obesity.  6. History of seizure disorder.  7. Osteoarthritis.  8. Acute on chronic renal insufficiency.  9. Degenerative joint disease.  10.Gout flare-up.  11.Dyslipidemia.  12.Hypertension.  13.Urinary tract infection.  14.Anemia during this admission.  15.Rhabdomyolysis (related to statin and seizure disorder).   ALLERGIES:  CODEINE.   MEDICATIONS:  1. Lantus.  2. NovoLog.  3. Klor-Con 20 mEq b.i.d.  4. Carvedilol 25 mg b.i.d.  5. Dilantin 300 mg q.h.s.  6. Amlodipine 10 mg daily.  7. Furosemide 80 mg b.i.d.  8. Enalapril 5 mg b.i.d.  9. Lipitor 40 mg daily.  10.Aspirin 325 mg daily.   SOCIAL HISTORY:  The patient is retired from [employer].  He is divorced.  He  has four children.  He smokes a half-pack a day for 60 years but quit in  1991.   FAMILY HISTORY:  Noncontributory for early coronary disease, though his  parents both had heart disease at older ages.   REVIEW OF SYSTEMS:  As stated in the HPI.   PHYSICAL EXAMINATION:  The patient is in no distress.  Blood pressure 140/72, heart rate 73 and regular.  Weight is 256 pounds.  Body mass index 34.  HEENT:  Eyelids unremarkable.  Pupils equal, round, and reactive to  light.  Fundi not visualized.  Oral mucosa unremarkable.  NECK:  No jugular venous distention at 45 degrees.  Carotid upstroke  brisk and symmetric.  No bruits, no thyromegaly.  No cervical, axillary or inguinal adenopathy.  LUNGS:  Clear to auscultation bilaterally.  BACK:  No costovertebral angle tenderness.  CHEST:  Unremarkable.  HEART:  PMI not displaced or  sustained, S1 and S2 within normal limits,  no S3, no S4, no clicks, no rubs, no murmurs.  ABDOMEN:  Obese, positive bowel sounds, normal in frequency and pitch.  No bruits, no rebound, no guarding, no midline pulsatile mass, no  hepatomegaly, no splenomegaly.  SKIN:  No rashes, no nodules.  EXTREMITIES:  2+ upper pulses, moderate bilateral lower extremity  nonpitting edema, absent dorsalis pedis and posterior tibialis  bilaterally.  NEUROLOGIC:  Oriented to person, place and time.  Cranial nerves II-XII  grossly intact.  Motor grossly intact.   EKG:  Sinus rhythm, premature atrial contractions, old inferior infarct,  poor anterior R-wave progression, probable old anteroseptal infarct, no  acute ST-wave changes.   ASSESSMENT AND PLAN:  1. Coronary disease.  The patient has coronary disease with bypass      grafts that are now 71 years old.  He does have a markedly abnormal      stress perfusion study.  This is compared to one in 2000 in which      he had inferolateral wall infarct at the mid and basal levels but      no ischemia.  Given this, there is a high probability of      progressive obstructive coronary disease.  He does not have much in      the way of symptoms with his longstanding diabetes.  A      catheterization would be warranted.  However, you need to have his      renal function improved to baseline.  I am going to check a BMET      today.  I have discussed the risks, benefits of catheterization.      He understands.  Provided his BMET is returning to baseline, will      most likely proceed with catheterization.  I would admit him to the      hospital and hydrate him beforehand and use as little dye as      possible.  2. Risk reduction.  He will continue with Lipitor.  He needs an LDL      less than 70 and an HDL in the 40s.  3. Hypertension.  Blood pressure is controlled and he will continue      the medications as listed.  4. Renal insufficiency as above.  5.  Diabetes per  Dr. Everardo All, who he is going to reestablish with as a      primary care doctor.  6. Follow-up.  We will see the patient back at the time of      catheterization.     Rollene Rotunda, MD, [facility]  Electronically Signed    JH/MedQ  DD: 12/12/2006  DT: 12/13/2006  Job #: [id number]   cc:   Gregary Signs A. Everardo All, MD

## 2010-08-02 NOTE — Consult Note (Signed)
NAME:  [name], TOZZI NO.:  [id number]   MEDICAL RECORD NO.:  [id number]          PATIENT TYPE:  INP   LOCATION:  6702                         FACILITY:  [facility]   PHYSICIAN:  Cecille Aver, M.D.DATE OF BIRTH:  11/17/1941   DATE OF CONSULTATION:  DATE OF DISCHARGE:                                 CONSULTATION   REQUESTING PHYSICIAN:  [facility].   REASON FOR REFERRAL:  CKD and acute on chronic renal failure.   HISTORY OF PRESENT ILLNESS:  Mr. Heisler is a 70 year old black male with  a past medical history significant for longstanding diabetes mellitus,  hypertension, coronary artery disease status post CABG, obesity as well  as reported history of CKD noted in notes back to 2004.  The patient is  followed by [facility], Dr. Audria Nine, and remembers her telling him  recently  about some kind of problem with his kidneys but we have no old  creatinine information.  The patient presented to the emergency  department on August 28 with a chief complaint of decreased mental  status.  Workup found an abnormal CT and MRI as wall as a urinary tract  infection with coag negative staph and initial creatinine level of 2.45.  He was admitted.  His ACE and diuretics were held and he was hydrated  and UTI was treated.  Creatinine then decreased to around 1.8-1.9.  Regarding other issues, neuro, neurosurgery and ENT were all consulted.  The patient was started on Dilantin.  He had a rocky hospital course to  include a fever and then developed shortness of breath which has since  been determined to be likely due to congestive heart failure.  A  cardiology consult was also obtained for positive CKs and troponin. More  recently his creatinine has climbed from a low of 1.8 to 2.5 and that  was why we were consulted today.  The patient has had multiple different  antibiotics and also has continued to be hydrated and is now very volume  overloaded with shortness of  breath.  His urine output has probably not  been well recorded but does seem to be adequate, at least there is no  significant drop-off.  He has remained on rather low dose Lasix and  admits to me that he has been pushing p.o. fluids.  His weight is up 9  kg since admit.  There has been no significant low blood pressures  recorded and no contrast was administrated to my seeing.  Also of note,  the patient is anemic that is seeming to worsen but this could be  secondary to dilution.   PAST MEDICAL HISTORY:  1. Diabetes mellitus with complication of Charcot joint.  2. Obesity.  3. Hypertension.  4. Coronary artery disease status post CABG.  5. History of seizure in the remote past.  6. History of CKD that was reported back in 2004 but no creatinines      have been noted.  7. Hyperlipidemia on a statin.   MEDICATIONS:  1. Norvasc 10.  2. Aspirin daily.  3. Ceftin 500 b.i.d.  4. Lovenox.  5. Lasix 40 mg q. 12 that was just increased to 80 mg q. 12.  6. Labetalol 800 mg b.i.d.  7. Nitro paste.  8. Protonix 40 daily.  9. Dilantin 300 q.h.s.  10.Prednisone 40 a day that was just started for gout.   ALLERGIES:  CODEINE.   SOCIAL HISTORY:  The patient is retired and lives in [place].  He  does have a history of tobacco use but quit in 1991. He denies alcohol  or drug use.   FAMILY HISTORY:  Positive for coronary disease, hypertension as well as  colon cancer.  He also reports that his sister is on dialysis and father  also had some kind of trouble with his kidneys.   REVIEW OF SYSTEMS:  Positive for lower extremity edema, shortness of  breath, dyspnea on exertion and inactivity as well as some decreased  urine output.  He denies nausea, vomiting, diarrhea, constipation or  rash.  Otherwise review of systems is negative.   PHYSICAL EXAM:  The patient is having some low grade temperatures to 99,  blood pressure 109/70, heart rate is 77, respirations are 20, oxygen  saturation  is 96% on 3 liters. The latest glucose is 97, 112 and 93.  GENERAL:  This is an edematous but alert black male who was sitting at  the bedside in no acute distress.  HEENT:  Pupils are equally round and reactive to light.  Extraocular  motions are intact and mucous membranes are moist.  NECK:  He does have jugular venous distention.  LUNGS:  There are decreased breath sounds at the bases and some edema to  his lower back.  CARDIOVASCULAR:  Regular rate and rhythm without murmur, gallop or rub.  ABDOMEN:  Positive bowel sounds, soft, nontender.  EXTREMITIES:  Reveal 2 to 3+ edema to the thighs.  NEURO:  The patient is alert and oriented.   LABORATORY DATA:  Hemoglobin is 8.4 which is down from 8.9 and down from  in the 10s on original admission. Potassium 4.6, BUN and creatinine 61  and 2.56 with a bicarb of 16.  Creatinine trends as noted in the chart.  CKs have been in the 15 and 1600s, troponins have been from 1.5 up to  2.8. BNP is 2177. Originally urinalysis did show evidence of UTI and  then was rechecked on the 31st which was negative for blood or protein.   ASSESSMENT:  A 70 year old black male with probable baseline chronic  kidney disease now with acute on chronic renal failure in the setting of  multiple antibiotics for a urinary tract infection, elevated CKs as well  as volume overload.  1. Renal:  Acute on chronic renal failure in the setting of possible      seizure with increased CK volume overload and UTI with antibiotics.      I will recheck urinalysis one more time and likely stop the Ceftin      since he has had 7 days of antibiotics.  I agree with checking      renal ultrasound.  We will work together to avoid further      nephrotoxins and to try to avoid extremes in blood pressure as      well.  2. Volume/blood pressure. The patient is clearly volume overloaded at      this time and is likely iatrogenic.  He has gained 9 kg since he      was admitted. I would  change his fluids  and slightly and increase      his Lasix quite a bit and also place him on a fluid restriction in      order to achieve diuresis.  I am hoping this will put him on the      correct place in the Starling curve so he will give better renal      perfusion.  3. Elevated CKs. Cardiac event versus seizure versus rhabdo secondary      to inactivity.  Because his bicarb is also low, I will give him a      low dose bicarb drip although he would continue to achieve      diuresis.  CKs will be followed.  Statin has been discontinued.  4. Anemia. Could be some dilution also from chronic kidney disease. He      is iron deficient so will start iron and Aranesp and I agree with      heme checking his stool.   Thank you very much for the consultation, we will continue to follow  with you.           ______________________________  Cecille Aver, M.D.     KAG/MEDQ  D:  11/22/2006  T:  11/23/2006  Job:  [id number]

## 2010-08-02 NOTE — Assessment & Plan Note (Signed)
[facility]                            CARDIOLOGY OFFICE NOTE   NAME:[name], [name]                       MRN:          [id number]  DATE:01/15/2007                            DOB:          1940-04-24    PRIMARY:  Dr. Romero Belling.   REASON FOR PRESENTATION:  Evaluate the patient with ischemic  cardiomyopathy.   HISTORY OF PRESENT ILLNESS:  The patient returns for followup after  recent cardiac catheterization.  This was done to evaluate an abnormal  stress perfusion study in the face of previous bypass surgery.  This  demonstrated left main was normal, the LAD was occluded proximally,  first diagonal had subtotal stenosis.  Second and third diagonals were  occluded at the ostium.  The circumflex included an 80% stenosis in the  ramus intermediate.  AV grove had proximal 75% stenosis and subtotal  stenosis before PDA.  The PDA had subtotal stenosis with some filling  via the LAD.  The right coronary artery is non-dominant and occluded  proximally.  Saphenous vein graft to OM1 and OM2 is occluded at the  ostium.  Saphenous vein graft to diagonal 1 and diagonal 2, and diagonal  3 was occluded at the ostium.  LIMA to the LAD was widely patent and  filled the large LAD, which wrapped the apex and fed collaterals.   It was decided on medical management for the patient.  He is known to  have an ejection fraction of about 36%.  He has had some renal  insufficiency, but has had followup labs demonstrating his creatinine  was 1.5 after the procedure.   He says he has done well.  He has had no shortness of breath.  He denies  any PND or orthopnea.  He has had no palpitations, pre-syncope, or  syncope.  He has had no chest pain.   PAST MEDICAL HISTORY:  Coronary artery disease (status post remote MI  and CABG in 1991).  Diabetes mellitus.  Hypertension.  Hyperlipidemia.  Ischemic cardiomyopathy (EF 36%).  Obesity.  Seizure disorder.  Osteoarthritis.  Acute  on chronic renal insufficiency.  Degenerative  joint disease.  Gout.  Urinary tract infection.  Anemia.  Rhabdomyolysis.   ALLERGIES:  CODEINE.   CURRENT MEDICATIONS:  1. Lantus.  2. NovoLog.  3. Klor-Con 20 mEq b.i.d.  4. Carvedilol 25 mg b.i.d.  5. Dilantin 300 mg nightly.  6. Amlodipine 10 mg daily.  7. Furosemide 80 mg b.i.d.  8. Enalapril 10 mg b.i.d.  9. Lipitor 40 mg daily.  10.Aspirin 325 mg daily.   REVIEW OF SYSTEMS:  As stated in the HPI and otherwise negative for  other systems.   PHYSICAL EXAMINATION:  The patient is in no distress.  Blood pressure 132/84, heart rate 67 and regular, weight 257 pounds,  body mass index 34.  HEENT:  Eyelids unremarkable.  Pupils are equal, round, and reactive to  light and accommodation.  Fundi are not visualized.  NECK:  No jugular venous distension at 45 degrees, carotid upstroke  brisk and symmetric, no bruits, thyromegaly.  LYMPHATICS:  No adenopathy.  LUNGS:  Clear to auscultation bilaterally.  BACK:  No costovertebral angle tenderness.  CHEST:  Well healed sternotomy scar.  HEART:  PMI not displaced or sustained, S1 and S2 within normal limits,  no S3, no S4, no clicks, rubs, murmurs.  ABDOMEN:  Obese, positive bowel sounds, normal in frequency and pitch,  no bruits, rebound, guarding.  No midline pulsatile masses,  hepatomegaly, splenomegaly.  SKIN:  No rashes, no nodules.  EXTREMITIES:  With 2+ upper pulses, mild bilateral lower extremity  edema, absent dorsalis pedis and posterior tibialis bilaterally, right  femoral bruit.  NEURO:  Grossly intact.   EKG:  Sinus rhythm, rate 67, premature atrial contractions, left axis  deviation, old inferior infarct, poor anterior R wave progression.  No  acute ST-T wave changes.   ASSESSMENT AND PLAN:  1. Ischemic cardiomyopathy.  Today, I will increase his carvedilol by      adding 6.25 mg twice a day to the current dose.  His target based      on his weight is 50 mg twice a  day.  At this point, no further      cardiovascular testing is suggested.  He will continue with      secondary risk reduction.  2. Renal insufficiency.  He had a stable creatinine.  Follow up after      catheterization.  This will be followed going forward.  3. Dyslipidemia.  Per primary care doctor.  The goal will be an LDL      less than 70 and an HDL greater than 40.  He will need to have this      followed up going forward.  4. Hypertension.  Blood pressure is well controlled.  Will continue to      manage this in the context of treating his ischemic cardiomyopathy.  5. Followup.  We will see him back in about 4 weeks in the [facility].     Rollene Rotunda, MD, [facility]  Electronically Signed    JH/MedQ  DD: 01/15/2007  DT: 01/15/2007  Job #: [id number]   cc:   Gregary Signs A. Everardo All, MD

## 2010-08-02 NOTE — Discharge Summary (Signed)
NAME:  [name], [name] NO.:  [id number]   MEDICAL RECORD NO.:  [id number]          PATIENT TYPE:  INP   LOCATION:  3705                         FACILITY:  [facility]   PHYSICIAN:  Rollene Rotunda, MD, FACCDATE OF BIRTH:  01-04-1941   DATE OF ADMISSION:  12/24/2006  DATE OF DISCHARGE:  12/26/2006                               DISCHARGE SUMMARY   PROCEDURES:  1. Cardiac catheterization.  2. Coronary arteriogram.  3. Bypass graft angiogram.  4. Aortic root injection.   PRIMARY FINAL DISCHARGE DIAGNOSIS:  Chest pain, medical therapy for  coronary artery disease.   SECONDARY DIAGNOSES:  1. Status post aortocoronary bypass surgery in 1991 with saphenous      vein graft to obtuse marginal one and obtuse marginal two,      saphenous vein graft to diagonal one and diagonal two, left      internal mammary artery to left anterior descending artery.  2. Diabetes.  3. Hypertension.  4. Hyperlipidemia.  5. Obesity.  6. Remote history of seizures.  7. Osteoarthritis.  8. Chronic kidney disease stage 3 with a GFR of 55 this admission.  9. Degenerative joint disease.  10.Gout.  11.History of urinary tract infection.  12.History of anemia.  13.History of rhabdomyolysis felt related to statin and seizure      disorder, statins discontinued.  14.Allergy or intolerance to CODEINE.  15.Remote history of tobacco use.  16.Family history of coronary artery disease in both parents (not      premature).  17.History of altered mental status, multifactorial, on admission in      September 2008.  18.Left frontal sinus partially calcified mucocele.  19.Ischemic cardiomyopathy with an ejection fraction of 36% at      [facility].  20.Chronic systolic congestive heart failure.   Time at discharge 42 minutes.   HOSPITAL COURSE:  [name] is a 70 year old male with a history of  coronary artery disease.  He was hospitalized from August 27 through  December 03, 2006, for multifactorial  altered mental status,  rhabdomyolysis, and acute-on-chronic renal failure.  He had elevated  cardiac enzymes during that admission but was not catheterized because  of his other medical issues.  He had a Myoview after discharge and was  evaluated by Dr. Antoine Poche on December 12, 2006.  His Myoview was  abnormal and it was felt there was a high probability of progressive  obstructive coronary disease.  [name] came to the hospital for  hydration and then catheterization on December 24, 2006.   Cardiac catheterization was performed on December 25, 2006, and showed a  totaled LAD with a patent LIMA graft and no critical distal disease.  D1  was subtotaled, D2 and D3 were totaled.  Circumflex had a long 80%  stenosis and was subtotaled distally.  The PDA was subtotaled and filled  via the LIMA and the RCA was totaled proximally.  The SVG to OM1 and OM2  as well as the SVG to D1 and D2 and D3 were all occluded at the ostium.  An aortic root injection was performed which confirmed the occluded  vein  grafts.  [name] was felt to have severe native three-vessel coronary  artery disease with two out of three grafts occluded.  Medical  management was felt to be the best option for him.   On December 26, 2006, [name] labs were stable with a BUN of 51 and a  creatinine of 1.53, which is actually slightly lower than pre  catheterization.  His hemoglobin was 10.8 with a hematocrit of 32 and  this is minimally changed from a hemoglobin of 11 and hematocrit of 32.6  pre catheterization.  He was ambulating without chest pain or shortness  of breath and considered stable for discharge with outpatient laboratory  work and followup arranged.   DISCHARGE INSTRUCTIONS:  1. His activity level is to be increased gradually.  2. He is to call our office for any problems with the catheterization      site.  3. He is to record daily weights.  4. He is to stick to a diabetic diet.  5. He is to get a BMET  on October 15 at 8:30 a.m.  6. He is to follow up with Dr. Audria Nine as well.   DISCHARGE MEDICATIONS:  1. Lantus 7 units q.h.s.  2. NovoLog 70/30, 4 units q.a.c.  3. Klor-Con 20 mEq b.i.d.  4. Carvedilol 25 mg b.i.d.  5. Dilantin 300 mg q.h.s.  6. Amlodipine 10 mg a day.  7. Furosemide 40 mg b.i.d.  8. Enalapril 5 mg b.i.d.  9. Lipitor 40 mg a day.  10.Aspirin 325 mg daily.      Theodore Demark, PA-C      Rollene Rotunda, MD, [facility]  Electronically Signed    RB/MEDQ  D:  12/26/2006  T:  12/26/2006  Job:  [id number]   cc:   Maurice March, M.D.

## 2010-08-02 NOTE — Assessment & Plan Note (Signed)
[facility]                          CHRONIC HEART FAILURE NOTE   NAME:[name], [name]                       MRN:          [id number]  DATE:07/22/2007                            DOB:          06-27-1940    PRIMARY CARDIOLOGIST:  Dr. Rollene Rotunda.   PRIMARY CARE:  Dr. Audria Nine at [facility].   NEPHROLOGIST:  Dr. Cecille Aver.   [name] returns today for follow-up of his congestive heart failure  which is secondary to ischemic cardiomyopathy with EF currently 30%  status post cardiac catheterization October 2008. [name] is  currently on Coreg 50 mg b.i.d. and has tolerated this dose without  problems. [name] only complaint is of recent gout flare up in his  right hand which prompted an emergency room visit.  He states he was  treated with allopurinol and colchicine, has since come off the  colchicine and is on a maintenance dose of allopurinol daily.  Mr.  [name] also had blood work done on April 4. This is the most recent  blood work that I have available. His creatinine is stable at 1.9. his  potassium was mildly elevated at 5.4 with a sodium of 134.  [name]  denies any symptoms suggestive of volume overload until today. He states  he snuck and put some salt on his potato salad before his fiance  caught him yesterday. Complaining of just very mild lower extremity  edema today.  Otherwise denied any presyncope or syncope,  lightheadedness or dizziness or chest discomfort.   PAST MEDICAL HISTORY:  1. Congestive heart failure secondary to ischemic cardiomyopathy with      EF currently 30%.  2. Coronary artery disease status post myocardial infarction and      bypass in 1991. Most recent catheterization in October 2008 with      recommendations for medical management.  3. Chronic renal insufficiency by followed by Dr. Kathrene Bongo.  4. Diabetes.  5. Hypertension.  6. Hyperlipidemia.  7. Seizure disorder requiring  Dilantin therapy.  8. Osteoarthritis/degenerative joint disease.  9. Gout.  10.Chronic anemia.   REVIEW OF SYSTEMS:  As stated above, otherwise negative.   CURRENT MEDICATIONS:  1. Lantus and NovoLog insulin as directed.  2. Klor-Con 20 mEq b.i.d.  3. Dilantin 100 mg 3 tablets q.h.s.  4. Amlodipine 10 mg daily.  5. Lipitor 40 mg daily.  6. Aspirin 325 mg daily.  7. Enalapril 5 mg b.i.d.  8. Furosemide 120 mg b.i.d.  9. Coreg 50 mg b.i.d.  10.Allopurinol 100 mg daily.   PHYSICAL EXAM:  Weight 248 pounds, weight is down 3 pounds. Blood  pressure initially recorded at 148/70, manual blood pressure checked  later in the visit 132/70. In reviewing the patient's blood pressures  from home, he averages 113/52 to 130/60.  Heart rate 67.  [name] is in no acute distress.  No signs of jugular vein distention  at 45 degree angle.  LUNGS:  Clear to auscultation bilaterally.  CARDIOVASCULAR:  S1 and S2 within normal limits.  ABDOMEN:  Obese, soft, nontender, positive bowel sounds.  LOWER  EXTREMITIES:  Without clubbing or cyanosis.  The patient has +1  pitting edema bilaterally.  NEUROLOGICAL:  Alert and oriented x3. Ambulating with the assistance of  a cane.   IMPRESSION:  1. Congestive heart failure secondary to ischemic cardiomyopathy.  The      patient on a full dose carvedilol at this time. Will plan on      repeating his 2-D echocardiogram for further evaluation. If no      improvement, the patient will need to be referred to EP for      consideration of ICD. He is currently class 2.  2. Renal insufficiency, stable creatinine. He has a follow-up      appointment with Dr. Kathrene Bongo on May 18. I will not check blood      work today.  Will defer this to Dr. Kathrene Bongo at the patient's      next appointment.  3. Coronary artery disease.  The patient is due for a routine      cardiology visit with Dr. Antoine Poche. Will arrange this today along      with his 2-D echocardiogram and  I will plan on seeing the patient      back after his appointment with  Dr. Antoine Poche. Will arrange for an      EP consult if needed after echocardiogram.      Dorian Pod, ACNP  Electronically Signed      [name], MD  Electronically Signed   MB/MedQ  DD: 07/22/2007  DT: 07/22/2007  Job #: [id number]   cc:   Maurice March, M.D.  Cecille Aver, M.D.

## 2010-08-05 NOTE — Consult Note (Signed)
NAME:  [name], [name] NO.:  [id number]   MEDICAL RECORD NO.:  [id number]                   PATIENT TYPE:  REC   LOCATION:  FOOT                                 FACILITY:  [facility]   PHYSICIAN:  Jonelle Sports. Sevier, M.D.              DATE OF BIRTH:  11/17/1941   DATE OF CONSULTATION:  07/24/2002  DATE OF DISCHARGE:                                   CONSULTATION   REPORT TITLE:  FOOT CENTER CONSULTATION/CLINIC NOTE.   REFERRING PHYSICIAN:  Maurice March, M.D.   HISTORY:  This 70 year old black male is seen at the courtesy of Dr.  Audria Nine for management of an ulcerated callus on the plantar aspect of the  right foot.   The patient does have longstanding type 2 diabetes which apparently is not  in very good control with recent hemoglobin A1c at 10.6%.  However, he has  avoided specific lower extremity complication of that disease.   Unfortunately, some 10 years ago, the patient stepped in a hole and  sustained a right mid foot fracture, the exact details of which are not  known.  He was treated with open reduction at that time and was casted but  has wound up with a collapse of the mid foot in a rocker bottom type  configuration in that foot.  As a result of that, and despite standard  inserts in his shoes, he has developed a callous formation underlying the  mid lateral right foot.  Recently, there has been some drainage from that  area and it is for this reason that he was referred here.   As mentioned, he has had no suspicion of cellulitis or other problems to  suggest deep infection in the area.   PAST MEDICAL HISTORY:  In addition to his poorly controlled diabetes, the  patient does have coronary artery disease and chronic renal insufficiency.  He is status post coronary artery bypass grafting.  He does not have angina  nor evidence of congestive heart failure.   ALLERGIES:  He is allergic to CODEINE.   MEDICATIONS:  1. Daily  aspirin.  2. Labetalol.  3. Zaroxolyn.  4. Lasix.  5. Norvasc.  6. Lipitor.  7. Catapres TTS.  8. Darvocet-N 100.  9. Enalapril.  10.      Celebrex.  11.      Avandia.  12.      Insulin.   PHYSICAL EXAMINATION:  EXTREMITIES:  Examination today is limited to the  distal lower extremities.  The patient's left foot is largely unremarkable  and without deformity or significant callous formation.  His right foot is  characterized by a rocker bottom configuration secondary to the old fracture  with striking callous formation in the lateral aspect of the mid foot at  approximately the cuneiform third and fourth metatarsal base area.  Skin  temperatures are normal and equal throughout both feet.  All pulses are  palpable and adequate.  He lacks protective sensation in his toes and  metatarsal head areas but it is preserved more proximally in the feet.  There is some degree of chronic stasis change in the skin of both lower  extremities and modest edema, slightly more so on the left than on the  right.   DISPOSITION:  1. The patient is given instruction regarding foot care and diabetes by     video with nurse and physician reinforcement.  2. It is particularly discussed with the patient the need for better control     of his diabetes, both to minimize the risk of infection in situations     like this and also to help him head off future foot problems.  3. Following cleaning, the area in question is subjected to full-thickness     debridement and removal of a great deal of callus.  He is left with an     ulcer measuring some 8 x 5 x 2 mm with no evidence of surrounding     infection or inflammation.  4. After discussion with the patient, he is in agreement with the program     with total contact casting to get this healed as quickly as possible,     after which we will place him in custom molded footwear in an effort to     attempt to prevent further ulceration of this area.  5.  Followup visit will be to this clinic in one week.                                               Jonelle Sports. Cheryll Cockayne, M.D.    RES/MEDQ  D:  07/24/2002  T:  07/25/2002  Job:  [id number]   cc:   Maurice March, M.D.  [address] [place]  [place] [address]  Fax: [phone]

## 2010-12-09 LAB — CBC
Hemoglobin: 9.2 — ABNORMAL LOW
MCHC: 35
MCV: 99.1
RDW: 12.9

## 2010-12-09 LAB — BASIC METABOLIC PANEL
CO2: 24
Calcium: 8.8
Chloride: 100
Creatinine, Ser: 2.23 — ABNORMAL HIGH
Glucose, Bld: 114 — ABNORMAL HIGH

## 2010-12-09 LAB — BODY FLUID CULTURE: Culture: NO GROWTH

## 2010-12-09 LAB — URINALYSIS, ROUTINE W REFLEX MICROSCOPIC
Bilirubin Urine: NEGATIVE
Glucose, UA: NEGATIVE
Hgb urine dipstick: NEGATIVE
Protein, ur: NEGATIVE
Urobilinogen, UA: 0.2

## 2010-12-09 LAB — DIFFERENTIAL
Basophils Absolute: 0
Basophils Relative: 0
Eosinophils Absolute: 0.1
Monocytes Absolute: 1.2 — ABNORMAL HIGH
Monocytes Relative: 16 — ABNORMAL HIGH
Neutro Abs: 5.4

## 2010-12-09 LAB — URIC ACID: Uric Acid, Serum: 10.5 — ABNORMAL HIGH

## 2010-12-09 LAB — SYNOVIAL CELL COUNT + DIFF, W/ CRYSTALS

## 2010-12-13 LAB — POCT I-STAT, CHEM 8
Calcium, Ion: 1.12
HCT: 33 — ABNORMAL LOW
Hemoglobin: 11.2 — ABNORMAL LOW
TCO2: 25

## 2010-12-15 LAB — URINALYSIS, ROUTINE W REFLEX MICROSCOPIC
Glucose, UA: NEGATIVE
Ketones, ur: NEGATIVE
Nitrite: NEGATIVE
Protein, ur: 30 — AB

## 2010-12-15 LAB — COMPREHENSIVE METABOLIC PANEL
ALT: 17
BUN: 67 — ABNORMAL HIGH
Calcium: 8.8
Creatinine, Ser: 1.69 — ABNORMAL HIGH
Glucose, Bld: 149 — ABNORMAL HIGH
Sodium: 133 — ABNORMAL LOW
Total Protein: 7.3

## 2010-12-15 LAB — URINE MICROSCOPIC-ADD ON

## 2010-12-15 LAB — URINE CULTURE

## 2010-12-15 LAB — DIFFERENTIAL
Lymphocytes Relative: 8 — ABNORMAL LOW
Lymphs Abs: 1.1
Monocytes Relative: 16 — ABNORMAL HIGH
Neutro Abs: 10 — ABNORMAL HIGH
Neutrophils Relative %: 76

## 2010-12-15 LAB — CULTURE, BLOOD (ROUTINE X 2): Culture: NO GROWTH

## 2010-12-15 LAB — CBC
Hemoglobin: 9.2 — ABNORMAL LOW
MCHC: 33.5
RDW: 14.8

## 2010-12-29 LAB — BASIC METABOLIC PANEL
BUN: 72 — ABNORMAL HIGH
BUN: 60 — ABNORMAL HIGH
CO2: 23
CO2: 25
CO2: 38 — ABNORMAL HIGH
Calcium: 8.8
Calcium: 8.4
Chloride: 104
Chloride: 107
Chloride: 107
Creatinine, Ser: 1.64 — ABNORMAL HIGH
Creatinine, Ser: 1.77 — ABNORMAL HIGH
GFR calc Af Amer: 51 — ABNORMAL LOW
GFR calc Af Amer: 55 — ABNORMAL LOW
GFR calc non Af Amer: 39 — ABNORMAL LOW
Glucose, Bld: 113 — ABNORMAL HIGH
Glucose, Bld: 118 — ABNORMAL HIGH
Glucose, Bld: 106 — ABNORMAL HIGH
Potassium: 4.3
Potassium: 4.5
Potassium: 4.7
Sodium: 138
Sodium: 139

## 2010-12-29 LAB — CBC
HCT: 32 — ABNORMAL LOW
HCT: 32.6 — ABNORMAL LOW
HCT: 35.3 — ABNORMAL LOW
Hemoglobin: 10.8 — ABNORMAL LOW
Hemoglobin: 11.9 — ABNORMAL LOW
MCHC: 33.7
MCHC: 33.9
MCV: 96.7
MCV: 97.7
MCV: 97.9
Platelets: 287
RBC: 3.27 — ABNORMAL LOW
RBC: 3.37 — ABNORMAL LOW
RDW: 15.3 — ABNORMAL HIGH
RDW: 15.4 — ABNORMAL HIGH
WBC: 6.4

## 2010-12-29 LAB — PROTIME-INR: Prothrombin Time: 14.5

## 2010-12-30 LAB — URINALYSIS, ROUTINE W REFLEX MICROSCOPIC
Bilirubin Urine: NEGATIVE
Bilirubin Urine: NEGATIVE
Bilirubin Urine: NEGATIVE
Glucose, UA: NEGATIVE
Hgb urine dipstick: NEGATIVE
Hgb urine dipstick: NEGATIVE
Ketones, ur: 15 — AB
Nitrite: NEGATIVE
Nitrite: POSITIVE — AB
Protein, ur: NEGATIVE
Protein, ur: NEGATIVE
Specific Gravity, Urine: 1.01
Specific Gravity, Urine: 1.012
Urobilinogen, UA: 0.2
Urobilinogen, UA: 0.2
Urobilinogen, UA: 0.2

## 2010-12-30 LAB — CK TOTAL AND CKMB (NOT AT ARMC)
CK, MB: 13.8 — ABNORMAL HIGH
Relative Index: 0.8
Relative Index: 0.8

## 2010-12-30 LAB — RENAL FUNCTION PANEL
Albumin: 2.5 — ABNORMAL LOW
Albumin: 2.7 — ABNORMAL LOW
Albumin: 2.7 — ABNORMAL LOW
Albumin: 2.7 — ABNORMAL LOW
Albumin: 2.9 — ABNORMAL LOW
BUN: 81 — ABNORMAL HIGH
BUN: 95 — ABNORMAL HIGH
CO2: 23
CO2: 24
Calcium: 8.4
Calcium: 8.5
Calcium: 8.7
Calcium: 8.8
Chloride: 96
Chloride: 97
Chloride: 98
Chloride: 98
Creatinine, Ser: 2.9 — ABNORMAL HIGH
Creatinine, Ser: 3.33 — ABNORMAL HIGH
Creatinine, Ser: 3.35 — ABNORMAL HIGH
GFR calc Af Amer: 22 — ABNORMAL LOW
GFR calc Af Amer: 23 — ABNORMAL LOW
GFR calc Af Amer: 26 — ABNORMAL LOW
GFR calc Af Amer: 26 — ABNORMAL LOW
GFR calc Af Amer: 30 — ABNORMAL LOW
GFR calc Af Amer: 34 — ABNORMAL LOW
GFR calc non Af Amer: 19 — ABNORMAL LOW
GFR calc non Af Amer: 19 — ABNORMAL LOW
GFR calc non Af Amer: 21 — ABNORMAL LOW
GFR calc non Af Amer: 22 — ABNORMAL LOW
GFR calc non Af Amer: 25 — ABNORMAL LOW
Glucose, Bld: 106 — ABNORMAL HIGH
Glucose, Bld: 136 — ABNORMAL HIGH
Glucose, Bld: 153 — ABNORMAL HIGH
Glucose, Bld: 94
Phosphorus: 3.4
Phosphorus: 3.5
Phosphorus: 3.8
Phosphorus: 4
Phosphorus: 4.4
Potassium: 3.3 — ABNORMAL LOW
Potassium: 3.4 — ABNORMAL LOW
Potassium: 3.9
Potassium: 4.1
Potassium: 4.2
Potassium: 4.5
Sodium: 132 — ABNORMAL LOW
Sodium: 137
Sodium: 137

## 2010-12-30 LAB — CBC
HCT: 24 — ABNORMAL LOW
HCT: 24.5 — ABNORMAL LOW
HCT: 25.9 — ABNORMAL LOW
HCT: 26.5 — ABNORMAL LOW
HCT: 27.1 — ABNORMAL LOW
Hemoglobin: 8.2 — ABNORMAL LOW
Hemoglobin: 8.4 — ABNORMAL LOW
Hemoglobin: 8.9 — ABNORMAL LOW
Hemoglobin: 9.3 — ABNORMAL LOW
Hemoglobin: 10 — ABNORMAL LOW
MCHC: 34.2
MCHC: 34.3
MCHC: 34.8
MCHC: 34.1
MCV: 97.3
MCV: 100.2 — ABNORMAL HIGH
Platelets: 183
Platelets: 221
Platelets: 306
Platelets: 329
Platelets: 220
RBC: 2.41 — ABNORMAL LOW
RBC: 2.46 — ABNORMAL LOW
RBC: 2.76 — ABNORMAL LOW
RBC: 2.82 — ABNORMAL LOW
RBC: 2.92 — ABNORMAL LOW
RDW: 12.9
RDW: 14.5 — ABNORMAL HIGH
RDW: 14.6 — ABNORMAL HIGH
RDW: 14.7 — ABNORMAL HIGH
WBC: 10.2
WBC: 10.7 — ABNORMAL HIGH
WBC: 11.4 — ABNORMAL HIGH
WBC: 11.6 — ABNORMAL HIGH
WBC: 12.1 — ABNORMAL HIGH

## 2010-12-30 LAB — DIFFERENTIAL
Basophils Absolute: 0
Lymphocytes Relative: 10 — ABNORMAL LOW
Lymphs Abs: 0.6 — ABNORMAL LOW
Monocytes Absolute: 0.6
Monocytes Relative: 9
Neutro Abs: 5.1

## 2010-12-30 LAB — BASIC METABOLIC PANEL
BUN: 44 — ABNORMAL HIGH
BUN: 48 — ABNORMAL HIGH
BUN: 50 — ABNORMAL HIGH
BUN: 49 — ABNORMAL HIGH
CO2: 16 — ABNORMAL LOW
CO2: 18 — ABNORMAL LOW
CO2: 38 — ABNORMAL HIGH
CO2: 22
CO2: 23
Calcium: 8.5
Calcium: 8.8
Calcium: 8.9
Calcium: 8.9
Calcium: 8.6
Calcium: 9.1
Chloride: 106
Chloride: 108
Chloride: 108
Chloride: 110
Creatinine, Ser: 1.89 — ABNORMAL HIGH
Creatinine, Ser: 1.99 — ABNORMAL HIGH
Creatinine, Ser: 2.35 — ABNORMAL HIGH
Creatinine, Ser: 1.81 — ABNORMAL HIGH
GFR calc Af Amer: 31 — ABNORMAL LOW
GFR calc Af Amer: 41 — ABNORMAL LOW
GFR calc Af Amer: 42 — ABNORMAL LOW
GFR calc Af Amer: 43 — ABNORMAL LOW
GFR calc Af Amer: 46 — ABNORMAL LOW
GFR calc non Af Amer: 34 — ABNORMAL LOW
GFR calc non Af Amer: 34 — ABNORMAL LOW
GFR calc non Af Amer: 36 — ABNORMAL LOW
GFR calc non Af Amer: 34 — ABNORMAL LOW
GFR calc non Af Amer: 38 — ABNORMAL LOW
Glucose, Bld: 76
Glucose, Bld: 82
Glucose, Bld: 88
Glucose, Bld: 92
Glucose, Bld: 99
Potassium: 4
Potassium: 4.6
Potassium: 4.7
Potassium: 3.9
Sodium: 132 — ABNORMAL LOW
Sodium: 138
Sodium: 141
Sodium: 137
Sodium: 137
Sodium: 140

## 2010-12-30 LAB — URINE MICROSCOPIC-ADD ON

## 2010-12-30 LAB — URINE CULTURE

## 2010-12-30 LAB — CULTURE, BLOOD (ROUTINE X 2)

## 2010-12-30 LAB — IRON AND TIBC
Iron: 37 — ABNORMAL LOW
Saturation Ratios: 14 — ABNORMAL LOW
UIBC: 226

## 2010-12-30 LAB — CARDIAC PANEL(CRET KIN+CKTOT+MB+TROPI)
CK, MB: 8.1 — ABNORMAL HIGH
Relative Index: 2.9 — ABNORMAL HIGH
Relative Index: 3 — ABNORMAL HIGH
Total CK: 174
Total CK: 269 — ABNORMAL HIGH
Troponin I: 0.33 — ABNORMAL HIGH

## 2010-12-30 LAB — T3, FREE: T3, Free: 1.7 — ABNORMAL LOW (ref 2.3–4.2)

## 2010-12-30 LAB — CROSSMATCH: Antibody Screen: NEGATIVE

## 2010-12-30 LAB — LIPID PANEL
LDL Cholesterol: 50
Total CHOL/HDL Ratio: 1.8
Triglycerides: 43
VLDL: 9

## 2010-12-30 LAB — RAPID URINE DRUG SCREEN, HOSP PERFORMED
Amphetamines: NOT DETECTED
Barbiturates: NOT DETECTED
Benzodiazepines: NOT DETECTED
Opiates: NOT DETECTED
Tetrahydrocannabinol: NOT DETECTED

## 2010-12-30 LAB — COMPREHENSIVE METABOLIC PANEL
AST: 26
Albumin: 3.4 — ABNORMAL LOW
Calcium: 8.9
Creatinine, Ser: 2.45 — ABNORMAL HIGH
GFR calc Af Amer: 32 — ABNORMAL LOW
GFR calc non Af Amer: 27 — ABNORMAL LOW
Sodium: 138
Total Protein: 7.4

## 2010-12-30 LAB — OCCULT BLOOD X 1 CARD TO LAB, STOOL: Fecal Occult Bld: NEGATIVE

## 2010-12-30 LAB — PHENYTOIN LEVEL, FREE AND TOTAL
Phenytoin Bound: 6.5
Phenytoin, Free: 0.95 — ABNORMAL LOW (ref 1.00–2.00)

## 2010-12-30 LAB — OVA AND PARASITE EXAMINATION

## 2010-12-30 LAB — HEMOGLOBIN A1C: Hgb A1c MFr Bld: 5.3

## 2010-12-30 LAB — TSH: TSH: 0.801

## 2010-12-30 LAB — PROTIME-INR: INR: 1.1

## 2010-12-30 LAB — RETICULOCYTES: Retic Count, Absolute: 33.9

## 2010-12-30 LAB — VITAMIN B12: Vitamin B-12: 793 (ref 211–911)

## 2010-12-30 LAB — CK: Total CK: 530 — ABNORMAL HIGH

## 2010-12-30 LAB — ABO/RH: ABO/RH(D): O POS

## 2010-12-30 LAB — APTT: aPTT: 31

## 2010-12-30 LAB — PHENYTOIN LEVEL, TOTAL: Phenytoin Lvl: 7 — ABNORMAL LOW

## 2010-12-30 LAB — TROPONIN I: Troponin I: 1.91

## 2012-01-19 DEATH — deceased

## 2012-02-27 ENCOUNTER — Emergency Department (HOSPITAL_COMMUNITY)
Admission: EM | Admit: 2012-02-27 | Discharge: 2012-02-27 | Disposition: A | Discharge status: Home / Self Care | Payer: PRIVATE HEALTH INSURANCE | Attending: Emergency Medicine | Admitting: Emergency Medicine

## 2012-02-27 ENCOUNTER — Encounter (HOSPITAL_COMMUNITY): Payer: Self-pay | Admitting: Emergency Medicine

## 2012-02-27 DIAGNOSIS — I10 Essential (primary) hypertension: Secondary | ICD-10-CM | POA: Insufficient documentation

## 2012-02-27 DIAGNOSIS — E119 Type 2 diabetes mellitus without complications: Secondary | ICD-10-CM | POA: Insufficient documentation

## 2012-02-27 DIAGNOSIS — R7889 Finding of other specified substances, not normally found in blood: Secondary | ICD-10-CM

## 2012-02-27 DIAGNOSIS — I509 Heart failure, unspecified: Secondary | ICD-10-CM | POA: Insufficient documentation

## 2012-02-27 DIAGNOSIS — Z79899 Other long term (current) drug therapy: Secondary | ICD-10-CM | POA: Insufficient documentation

## 2012-02-27 DIAGNOSIS — R892 Abnormal level of other drugs, medicaments and biological substances in specimens from other organs, systems and tissues: Secondary | ICD-10-CM | POA: Insufficient documentation

## 2012-02-27 DIAGNOSIS — Z7982 Long term (current) use of aspirin: Secondary | ICD-10-CM | POA: Insufficient documentation

## 2012-02-27 HISTORY — DX: Essential (primary) hypertension: I10

## 2012-02-27 HISTORY — DX: Type 2 diabetes mellitus without complications: E11.9

## 2012-02-27 HISTORY — DX: Heart failure, unspecified: I50.9

## 2012-02-27 HISTORY — DX: Unspecified convulsions: R56.9

## 2012-02-27 LAB — COMPREHENSIVE METABOLIC PANEL
AST: 37 U/L (ref 0–37)
Albumin: 3.8 g/dL (ref 3.5–5.2)
BUN: 88 mg/dL — ABNORMAL HIGH (ref 6–23)
Calcium: 9.5 mg/dL (ref 8.4–10.5)
Creatinine, Ser: 1.86 mg/dL — ABNORMAL HIGH (ref 0.50–1.35)
GFR calc non Af Amer: 35 mL/min — ABNORMAL LOW (ref >90)

## 2012-02-27 LAB — CBC WITH DIFFERENTIAL/PLATELET
Basophils Absolute: 0 10*3/uL (ref 0.0–0.1)
Basophils Relative: 0 % (ref 0–1)
Eosinophils Relative: 2 % (ref 0–5)
HCT: 34.6 % — ABNORMAL LOW (ref 39.0–52.0)
MCH: 34.9 pg — ABNORMAL HIGH (ref 26.0–34.0)
MCHC: 34.4 g/dL (ref 30.0–36.0)
MCV: 101.5 fL — ABNORMAL HIGH (ref 78.0–100.0)
Monocytes Absolute: 0.6 10*3/uL (ref 0.1–1.0)
RDW: 12.6 % (ref 11.5–15.5)

## 2012-02-27 LAB — GLUCOSE, CAPILLARY: Glucose-Capillary: 129 mg/dL — ABNORMAL HIGH (ref 70–99)

## 2012-02-27 LAB — PHENYTOIN LEVEL, TOTAL: Phenytoin Lvl: 7.8 ug/mL — ABNORMAL LOW (ref 10.0–20.0)

## 2012-02-27 MED ORDER — PHENYTOIN SODIUM EXTENDED 100 MG PO CAPS
300.0000 mg | ORAL_CAPSULE | Freq: Once | ORAL | Status: AC
  Administered 2012-02-27: 300 mg via ORAL
  Filled 2012-02-27: qty 3

## 2012-02-27 NOTE — ED Notes (Signed)
Notified NP Onalee Hua about patients plan of care

## 2012-02-27 NOTE — ED Notes (Signed)
Pt is alert and oriented x4

## 2012-02-27 NOTE — ED Provider Notes (Signed)
History     CSN: [id number]  Arrival date & time 02/27/12  1827   First MD Initiated Contact with Patient 02/27/12 1841      Chief Complaint  Patient presents with  . Seizures    (Consider location/radiation/quality/duration/timing/severity/associated sxs/prior treatment) HPI Comments: [name] is a 71 y.o. Male who was with a nurse , when he had a seizure today. Duration was less than 2 minutes. He is reported to be post the following a seizure. He was transferred by EMS. Patient cannot contribute to history   Level V Caveat- Confusion  The history is provided by the patient.    Past Medical History  Diagnosis Date  . Seizures   . CHF (congestive heart failure)   . Diabetes mellitus without complication   . Hypertension     History reviewed. No pertinent past surgical history.  No family history on file.  History  Substance Use Topics  . Smoking status: Never Smoker   . Smokeless tobacco: Not on file  . Alcohol Use: No      Review of Systems  Unable to perform ROS   Allergies  Review of patient's allergies indicates no known allergies.  Home Medications   Current Outpatient Rx  Name  Route  Sig  Dispense  Refill  . ALLOPURINOL 100 MG PO TABS   Oral   Take 100 mg by mouth daily.         Marland Kitchen AMLODIPINE BESYLATE 10 MG PO TABS   Oral   Take 10 mg by mouth daily.         . ASPIRIN 325 MG PO TBEC   Oral   Take 325 mg by mouth daily.         . ATORVASTATIN CALCIUM 40 MG PO TABS   Oral   Take 40 mg by mouth daily.         Marland Kitchen CARVEDILOL 25 MG PO TABS   Oral   Take 50 mg by mouth 2 (two) times daily with a meal.         . ENALAPRIL MALEATE 5 MG PO TABS   Oral   Take 5 mg by mouth 2 (two) times daily.         . FUROSEMIDE 80 MG PO TABS   Oral   Take 80 mg by mouth 2 (two) times daily.         Marland Kitchen MAGNESIUM OXIDE 400 MG PO TABS   Oral   Take 400 mg by mouth daily.         Marland Kitchen PHENYTOIN SODIUM EXTENDED 100 MG PO CAPS   Oral   Take 300 mg by mouth daily.           BP 125/66  Pulse 76  Temp 98.9 F (37.2 C) (Oral)  Resp 19  SpO2 99%  Physical Exam  Nursing note and vitals reviewed. Constitutional: He appears well-developed.       Elderly, frail  HENT:  Head: Normocephalic and atraumatic.  Right Ear: External ear normal.  Left Ear: External ear normal.  Eyes: Conjunctivae normal and EOM are normal. Pupils are equal, round, and reactive to light.  Neck: Normal range of motion and phonation normal. Neck supple.  Cardiovascular: Normal rate, regular rhythm, normal heart sounds and intact distal pulses.   Pulmonary/Chest: Effort normal and breath sounds normal. He exhibits no bony tenderness.  Abdominal: Soft. Normal appearance. There is no tenderness.  Musculoskeletal: Normal range of motion.  Neurological: He is  alert. He has normal strength. No cranial nerve deficit or sensory deficit. He exhibits normal muscle tone. Coordination normal.       Oriented only to person  Skin: Skin is warm, dry and intact.  Psychiatric: He has a normal mood and affect. His behavior is normal.    ED Course  Procedures (including critical care time)   No seizures during evaluation. In emergency department.   Date: 01/05/2012  Rate: 73  Rhythm: normal sinus rhythm  QRS Axis: normal  PR and QT Intervals: normal  ST/T Wave abnormalities: normal  PR and QRS Conduction Disutrbances:none  Narrative Interpretation:   Old EKG Reviewed: none available   Labs Reviewed  CBC WITH DIFFERENTIAL - Abnormal; Notable for the following:    RBC 3.41 (*)     Hemoglobin 11.9 (*)     HCT 34.6 (*)     MCV 101.5 (*)     MCH 34.9 (*)     All other components within normal limits  COMPREHENSIVE METABOLIC PANEL - Abnormal; Notable for the following:    Glucose, Bld 134 (*)     BUN 88 (*)     Creatinine, Ser 1.86 (*)     Total Bilirubin 0.2 (*)     GFR calc non Af Amer 35 (*)     GFR calc Af Amer 40 (*)     All other  components within normal limits  PHENYTOIN LEVEL, TOTAL - Abnormal; Notable for the following:    Phenytoin Lvl 7.8 (*)     All other components within normal limits  GLUCOSE, CAPILLARY - Abnormal; Notable for the following:    Glucose-Capillary 129 (*)     All other components within normal limits      1. Subtherapeutic serum dilantin level       MDM  Seizure disorder, with seizure, secondary to subtherapeutic Diantin level. No other inciting causes. Patient sent to CDU for completion of treatment        Clifford Melter, MD 02/28/12 0102

## 2012-02-27 NOTE — ED Provider Notes (Signed)
Patient moved to CDU pending completion of lab testing in the evaluation of seizures.  Patient is now conscious, alert, states he is ready to go home.  Dilantin level low at 7.8--will give correction dose of 300 mg now.  Patient to resume normal dosing this evening upon return home.  Case discussed with Dr. Effie Shy prior to discharging patient.  Jimmye Norman, NP 02/27/12 2046

## 2012-02-27 NOTE — ED Notes (Signed)
Checked patient cbg it was 129 notifed Medical illustrator

## 2012-02-27 NOTE — ED Notes (Signed)
Patient lives by himself and witnessed said saw patient sitting in a recliner and stated seizure like activity 60-90 seconds. EMS called stated non verbal at first and now able to respond to questions. CBG 117 EKG NSR.

## 2012-02-28 NOTE — ED Provider Notes (Signed)
Medical screening examination/treatment/procedure(s) were conducted as a shared visit with non-physician practitioner(s) and myself.  I personally evaluated the patient during the encounter  Flint Melter, MD 02/28/12 [phone]

## 2012-07-13 ENCOUNTER — Emergency Department (HOSPITAL_COMMUNITY): Payer: PRIVATE HEALTH INSURANCE

## 2012-07-13 ENCOUNTER — Emergency Department (HOSPITAL_COMMUNITY)
Admission: EM | Admit: 2012-07-13 | Discharge: 2012-07-13 | Disposition: A | Discharge status: Home / Self Care | Payer: PRIVATE HEALTH INSURANCE | Attending: Emergency Medicine | Admitting: Emergency Medicine

## 2012-07-13 DIAGNOSIS — G40802 Other epilepsy, not intractable, without status epilepticus: Secondary | ICD-10-CM | POA: Insufficient documentation

## 2012-07-13 DIAGNOSIS — Y9289 Other specified places as the place of occurrence of the external cause: Secondary | ICD-10-CM | POA: Insufficient documentation

## 2012-07-13 DIAGNOSIS — I509 Heart failure, unspecified: Secondary | ICD-10-CM | POA: Insufficient documentation

## 2012-07-13 DIAGNOSIS — S82209A Unspecified fracture of shaft of unspecified tibia, initial encounter for closed fracture: Secondary | ICD-10-CM | POA: Insufficient documentation

## 2012-07-13 DIAGNOSIS — Z79899 Other long term (current) drug therapy: Secondary | ICD-10-CM | POA: Insufficient documentation

## 2012-07-13 DIAGNOSIS — I10 Essential (primary) hypertension: Secondary | ICD-10-CM | POA: Insufficient documentation

## 2012-07-13 DIAGNOSIS — W010XXA Fall on same level from slipping, tripping and stumbling without subsequent striking against object, initial encounter: Secondary | ICD-10-CM | POA: Insufficient documentation

## 2012-07-13 DIAGNOSIS — E119 Type 2 diabetes mellitus without complications: Secondary | ICD-10-CM | POA: Insufficient documentation

## 2012-07-13 DIAGNOSIS — Y939 Activity, unspecified: Secondary | ICD-10-CM | POA: Insufficient documentation

## 2012-07-13 DIAGNOSIS — Z7982 Long term (current) use of aspirin: Secondary | ICD-10-CM | POA: Insufficient documentation

## 2012-07-13 DIAGNOSIS — S82202A Unspecified fracture of shaft of left tibia, initial encounter for closed fracture: Secondary | ICD-10-CM

## 2012-07-13 MED ORDER — TETANUS-DIPHTH-ACELL PERTUSSIS 5-2.5-18.5 LF-MCG/0.5 IM SUSP
0.5000 mL | Freq: Once | INTRAMUSCULAR | Status: AC
  Administered 2012-07-13: 0.5 mL via INTRAMUSCULAR
  Filled 2012-07-13: qty 0.5

## 2012-07-13 MED ORDER — HYDROCODONE-ACETAMINOPHEN 5-325 MG PO TABS
2.0000 | ORAL_TABLET | Freq: Once | ORAL | Status: AC
  Administered 2012-07-13: 2 via ORAL
  Filled 2012-07-13: qty 2

## 2012-07-13 NOTE — ED Notes (Signed)
Pt requesting something for pain.   Vicodin 2 tabs given for same.

## 2012-07-13 NOTE — ED Provider Notes (Addendum)
History     CSN: [id number]  Arrival date & time 07/13/12  1546   First MD Initiated Contact with Patient 07/13/12 1547      Chief Complaint  Patient presents with  . Fall    (Consider location/radiation/quality/duration/timing/severity/associated sxs/prior treatment) HPI Comments: Brought to the ER for evaluation after a fall. Patient tripped and fell in a parking lot. He reports pain in both of his knees. He also is having pain in the right groin and hip area. Fall occurred earlier today. He is having pain when he walks. Pain is moderate to severe. He did hit his head but there was no loss of consciousness. Patient has no headache no neck pain currently.family reports that he had a large contusion on his forehead which has gone away since he iced it.  Patient is a 72 y.o. male presenting with fall.  Fall Pertinent negatives include no abdominal pain.    Past Medical History  Diagnosis Date  . Seizures   . CHF (congestive heart failure)   . Diabetes mellitus without complication   . Hypertension     No past surgical history on file.  No family history on file.  History  Substance Use Topics  . Smoking status: Never Smoker   . Smokeless tobacco: Not on file  . Alcohol Use: No      Review of Systems  Respiratory: Negative for shortness of breath.   Cardiovascular: Negative for chest pain.  Gastrointestinal: Negative for abdominal pain.  Skin: Positive for wound.  All other systems reviewed and are negative.    Allergies  Review of patient's allergies indicates no known allergies.  Home Medications   Current Outpatient Rx  Name  Route  Sig  Dispense  Refill  . allopurinol (ZYLOPRIM) 100 MG tablet   Oral   Take 100 mg by mouth daily.         Marland Kitchen amLODipine (NORVASC) 10 MG tablet   Oral   Take 10 mg by mouth daily.         Marland Kitchen aspirin 325 MG EC tablet   Oral   Take 325 mg by mouth daily.         Marland Kitchen atorvastatin (LIPITOR) 40 MG tablet   Oral   Take  40 mg by mouth daily.         . carvedilol (COREG) 25 MG tablet   Oral   Take 50 mg by mouth 2 (two) times daily with a meal.         . enalapril (VASOTEC) 5 MG tablet   Oral   Take 5 mg by mouth 2 (two) times daily.         . furosemide (LASIX) 80 MG tablet   Oral   Take 80 mg by mouth 2 (two) times daily.         . magnesium oxide (MAG-OX) 400 MG tablet   Oral   Take 400 mg by mouth daily.         . phenytoin (DILANTIN) 100 MG ER capsule   Oral   Take 300 mg by mouth daily.           BP 117/52  Pulse 70  Temp(Src) 98.5 F (36.9 C) (Oral)  Resp 18  SpO2 99%  Physical Exam  Constitutional: He is oriented to person, place, and time. He appears well-developed and well-nourished. No distress.  HENT:  Head: Normocephalic and atraumatic.  Right Ear: Hearing normal.  Nose: Nose normal.  Mouth/Throat: Oropharynx  is clear and moist and mucous membranes are normal.  Eyes: Conjunctivae and EOM are normal. Pupils are equal, round, and reactive to light.  Neck: Normal range of motion. Neck supple.  Cardiovascular: Normal rate, regular rhythm, S1 normal and S2 normal.  Exam reveals no gallop and no friction rub.   No murmur heard. Pulmonary/Chest: Effort normal and breath sounds normal. No respiratory distress. He exhibits no tenderness.  Abdominal: Soft. Normal appearance and bowel sounds are normal. There is no hepatosplenomegaly. There is no tenderness. There is no rebound, no guarding, no tenderness at McBurney's point and negative Murphy's sign. No hernia.  Musculoskeletal: Normal range of motion.       Right hip: He exhibits tenderness. He exhibits normal range of motion.       Right knee: He exhibits normal range of motion, no swelling, no effusion and no deformity.       Left knee: He exhibits normal range of motion, no swelling, no effusion and no deformity.  Neurological: He is alert and oriented to person, place, and time. He has normal strength. No cranial  nerve deficit or sensory deficit. Coordination normal. GCS eye subscore is 4. GCS verbal subscore is 5. GCS motor subscore is 6.  Skin: Skin is warm, dry and intact. No rash noted. No cyanosis.     Psychiatric: He has a normal mood and affect. His speech is normal and behavior is normal. Thought content normal.    ED Course  Procedures (including critical care time)  Labs Reviewed - No data to display Dg Hip Complete Right  07/13/2012  *RADIOLOGY REPORT*  Clinical Data: Fall with right hip pain.  RIGHT HIP - COMPLETE 2+ VIEW  Comparison: None  Findings: There is no evidence of fracture, subluxation or dislocation. Mild degenerative changes are present within both hips. No focal bony lesions are noted. Mild degenerative changes in the lower lumbar spine are present.  IMPRESSION: No evidence of acute abnormality.  Mild degenerative changes within the hips and lower lumbar spine.   Original Report Authenticated By: Harmon Pier, M.D.    Ct Head Wo Contrast  07/13/2012  *RADIOLOGY REPORT*  Clinical Data: Fall.  Head injury  CT HEAD WITHOUT CONTRAST  Technique:  Contiguous axial images were obtained from the base of the skull through the vertex without contrast.  Comparison: CT sinus 11/15/2006  Findings: Frontal lobe atrophy.  Encephalomalacia left inferior frontal lobe is  unchanged from prior studies.  Negative for acute infarct.  Negative for acute hemorrhage or mass.  Chronic sinus disease with mucosal edema.  There is expansion of left frontal ethmoid sinus with calcification, similar to the prior study.  This is most likely due to a chronic mucocele.  There is a defect in the posterior wall of the left frontal sinus, unchanged from the prior study.  IMPRESSION: No acute abnormality and no change from prior studies.  Chronic sinusitis and chronic mucocele left frontal ethmoid sinus.   Original Report Authenticated By: Janeece Riggers, M.D.    Dg Knee Complete 4 Views Left  07/13/2012  *RADIOLOGY REPORT*   Clinical Data: Bilateral knee pain.  History of injury from fall.  LEFT KNEE - COMPLETE 4+ VIEW  Comparison: No priors.  Findings: There appears to be a mildly comminuted nondisplaced fracture of the tibial tuberosity. This appears to extend posteriorly, along the lateral aspect of the lateral intercondylar eminence.  No acute subluxation or dislocation is noted.  IMPRESSION: 1.  Acute nondisplaced fracture of the proximal  tibia involving the tibial tubercle anteriorly, extending posteriorly with involvement of the lateral intercondylar eminence.   Original Report Authenticated By: Trudie Reed, M.D.    Dg Knee Complete 4 Views Right  07/13/2012  *RADIOLOGY REPORT*  Clinical Data: Fall.  Knee pain  RIGHT KNEE - COMPLETE 4+ VIEW  Comparison: 04/22/2007  Findings: Progressive degenerative changes in the knee.  There is joint space narrowing laterally with a defect in the lateral femoral condyle compatible with osteochondritis dissecans.  There is mild patellofemoral degenerative change.  There is a joint effusion.  There is popliteal artery calcification.  Negative for acute fracture.  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  There is a joint effusion.  Negative for acute fracture.   Original Report Authenticated By: Janeece Riggers, M.D.      Diagnosis: Proximal tibia fracture, Left    MDM  Patient comes to the ER for evaluation after a fall. Patient reports that he was ambulating and tripped, fell forward. He has abrasions of his knees and is complaining of knee pain. He also is complaining of left hip pain. He denied his head and there was no loss of consciousness. Patient denies headache. Neck and back were clinically cleared because there is no tenderness or pain.   Had a large hematoma on his forehead previously but this really with applying ice. Head CT was performed because of this and there was no intracranial injury.  Patient's hip is unremarkable.  X-ray of the knee shows proximal tibia fracture which is nondisplaced. This was discussed with Dr. Darrelyn Hillock. Confirms that the patient can be limited weightbearing with knee immobilizerand is appropriate for outpatient followup in the office. He will see the patient in the office Monday morning at 8 AM. He recommends increasing to aspirin 325 mg twice a day.         Gilda Crease, MD 07/13/12 2209  Gilda Crease, MD 07/13/12 [phone]

## 2012-07-13 NOTE — ED Notes (Signed)
Pt fell in parking lot to his knees and hit his head.  Denies loc.  Abrasions to bil knees.

## 2012-07-13 NOTE — ED Notes (Signed)
Pt to ED via [facility] after reported falling in a parking lot.  Pt c/o bil leg pain.

## 2012-12-02 ENCOUNTER — Encounter (INDEPENDENT_AMBULATORY_CARE_PROVIDER_SITE_OTHER): Payer: Self-pay | Admitting: Ophthalmology

## 2012-12-16 ENCOUNTER — Encounter (INDEPENDENT_AMBULATORY_CARE_PROVIDER_SITE_OTHER): Payer: Medicare Other | Admitting: Ophthalmology

## 2012-12-16 DIAGNOSIS — E1039 Type 1 diabetes mellitus with other diabetic ophthalmic complication: Secondary | ICD-10-CM

## 2012-12-16 DIAGNOSIS — H35039 Hypertensive retinopathy, unspecified eye: Secondary | ICD-10-CM

## 2012-12-16 DIAGNOSIS — H35379 Puckering of macula, unspecified eye: Secondary | ICD-10-CM

## 2012-12-16 DIAGNOSIS — I10 Essential (primary) hypertension: Secondary | ICD-10-CM

## 2012-12-16 DIAGNOSIS — E11359 Type 2 diabetes mellitus with proliferative diabetic retinopathy without macular edema: Secondary | ICD-10-CM

## 2012-12-16 DIAGNOSIS — H4310 Vitreous hemorrhage, unspecified eye: Secondary | ICD-10-CM

## 2013-06-16 ENCOUNTER — Ambulatory Visit (INDEPENDENT_AMBULATORY_CARE_PROVIDER_SITE_OTHER): Payer: Medicare Other | Admitting: Ophthalmology

## 2015-03-05 ENCOUNTER — Encounter (INDEPENDENT_AMBULATORY_CARE_PROVIDER_SITE_OTHER): Payer: Self-pay | Admitting: Ophthalmology

## 2015-03-23 ENCOUNTER — Encounter (HOSPITAL_COMMUNITY): Payer: Self-pay | Admitting: Emergency Medicine

## 2015-03-23 ENCOUNTER — Emergency Department (HOSPITAL_COMMUNITY)
Admission: EM | Admit: 2015-03-23 | Discharge: 2015-03-23 | Disposition: A | Discharge status: Home / Self Care | Payer: Medicare Other | Attending: Physician Assistant | Admitting: Physician Assistant

## 2015-03-23 DIAGNOSIS — Z794 Long term (current) use of insulin: Secondary | ICD-10-CM | POA: Insufficient documentation

## 2015-03-23 DIAGNOSIS — E119 Type 2 diabetes mellitus without complications: Secondary | ICD-10-CM | POA: Insufficient documentation

## 2015-03-23 DIAGNOSIS — M79604 Pain in right leg: Secondary | ICD-10-CM | POA: Insufficient documentation

## 2015-03-23 DIAGNOSIS — Z7982 Long term (current) use of aspirin: Secondary | ICD-10-CM | POA: Insufficient documentation

## 2015-03-23 DIAGNOSIS — I509 Heart failure, unspecified: Secondary | ICD-10-CM | POA: Insufficient documentation

## 2015-03-23 DIAGNOSIS — M79605 Pain in left leg: Secondary | ICD-10-CM | POA: Insufficient documentation

## 2015-03-23 DIAGNOSIS — I10 Essential (primary) hypertension: Secondary | ICD-10-CM | POA: Diagnosis not present

## 2015-03-23 DIAGNOSIS — D649 Anemia, unspecified: Secondary | ICD-10-CM | POA: Diagnosis not present

## 2015-03-23 DIAGNOSIS — M791 Myalgia, unspecified site: Secondary | ICD-10-CM

## 2015-03-23 DIAGNOSIS — Z79899 Other long term (current) drug therapy: Secondary | ICD-10-CM | POA: Diagnosis not present

## 2015-03-23 LAB — CBC WITH DIFFERENTIAL/PLATELET
BASOS ABS: 0 10*3/uL (ref 0.0–0.1)
Basophils Relative: 0 %
EOS PCT: 2 %
Eosinophils Absolute: 0.1 10*3/uL (ref 0.0–0.7)
HEMATOCRIT: 33.6 % — AB (ref 39.0–52.0)
HEMOGLOBIN: 11.5 g/dL — AB (ref 13.0–17.0)
LYMPHS ABS: 1.1 10*3/uL (ref 0.7–4.0)
LYMPHS PCT: 34 %
MCH: 35.6 pg — AB (ref 26.0–34.0)
MCHC: 34.2 g/dL (ref 30.0–36.0)
MCV: 104 fL — AB (ref 78.0–100.0)
Monocytes Absolute: 0.5 10*3/uL (ref 0.1–1.0)
Monocytes Relative: 15 %
NEUTROS ABS: 1.6 10*3/uL — AB (ref 1.7–7.7)
Neutrophils Relative %: 49 %
Platelets: 129 10*3/uL — ABNORMAL LOW (ref 150–400)
RBC: 3.23 MIL/uL — AB (ref 4.22–5.81)
RDW: 13.1 % (ref 11.5–15.5)
WBC: 3.2 10*3/uL — AB (ref 4.0–10.5)

## 2015-03-23 LAB — COMPREHENSIVE METABOLIC PANEL
ALBUMIN: 3.5 g/dL (ref 3.5–5.0)
ALT: 16 U/L — ABNORMAL LOW (ref 17–63)
ANION GAP: 11 (ref 5–15)
AST: 24 U/L (ref 15–41)
Alkaline Phosphatase: 98 U/L (ref 38–126)
BILIRUBIN TOTAL: 0.4 mg/dL (ref 0.3–1.2)
BUN: 73 mg/dL — AB (ref 6–20)
CHLORIDE: 103 mmol/L (ref 101–111)
CO2: 25 mmol/L (ref 22–32)
Calcium: 9.2 mg/dL (ref 8.9–10.3)
Creatinine, Ser: 1.75 mg/dL — ABNORMAL HIGH (ref 0.61–1.24)
GFR calc Af Amer: 42 mL/min — ABNORMAL LOW (ref >60)
GFR, EST NON AFRICAN AMERICAN: 36 mL/min — AB (ref >60)
Glucose, Bld: 128 mg/dL — ABNORMAL HIGH (ref 65–99)
POTASSIUM: 4.2 mmol/L (ref 3.5–5.1)
Sodium: 139 mmol/L (ref 135–145)
TOTAL PROTEIN: 7.6 g/dL (ref 6.5–8.1)

## 2015-03-23 NOTE — ED Notes (Signed)
Per EMS: pt from home c/o right arm and leg pain and spasms x 3 days getting more severe

## 2015-03-23 NOTE — ED Provider Notes (Signed)
CSN: [id number]     Arrival date & time 03/23/15  1056 History   First MD Initiated Contact with Patient 03/23/15 1415     Chief Complaint  Patient presents with  . Leg Pain     (Consider location/radiation/quality/duration/timing/severity/associated sxs/prior Treatment) Patient is a 75 y.o. male presenting with leg pain. The history is provided by the patient and medical records. No language interpreter was used.  Leg Pain Associated symptoms: no back pain and no neck pain    [name] is a 75 y.o. male  with a PMH of DM, HTN, CHF, seizures who presents to the Emergency Department complaining of bilateral upper and lower extremity muscle spasms x 3 days. Patient states cramps migrate between all four extremities and have been getting worse over the 3 day course. Patient is on lasix 80mg  BID. Patient also admits to episodes of vertigo - seen by PCP approx. 3 weeks ago and given medication, states symptoms are improving but he will still get dizzy when he first lays down.   Past Medical History  Diagnosis Date  . Seizures (HCC)   . CHF (congestive heart failure) (HCC)   . Diabetes mellitus without complication (HCC)   . Hypertension    Past Surgical History  Procedure Laterality Date  . Coronary artery bypass graft     History reviewed. No pertinent family history. Social History  Substance Use Topics  . Smoking status: Never Smoker   . Smokeless tobacco: None  . Alcohol Use: No    Review of Systems  Constitutional: Negative.   HENT: Negative for congestion, rhinorrhea and sore throat.   Eyes: Negative for visual disturbance.  Respiratory: Negative for cough, shortness of breath and wheezing.   Cardiovascular: Negative.   Gastrointestinal: Positive for constipation. Negative for nausea, vomiting, abdominal pain and diarrhea.  Musculoskeletal: Positive for myalgias. Negative for back pain, arthralgias and neck pain.  Skin: Negative for rash.  Neurological: Positive for  dizziness. Negative for weakness and headaches.  Hematological: Does not bruise/bleed easily.      Allergies  Codeine  Home Medications   Prior to Admission medications   Medication Sig Start Date End Date Taking? Authorizing Provider  allopurinol (ZYLOPRIM) 100 MG tablet Take 100 mg by mouth daily.   Yes Historical Provider, MD  amLODipine (NORVASC) 10 MG tablet Take 10 mg by mouth daily.   Yes Historical Provider, MD  aspirin 325 MG EC tablet Take 325 mg by mouth at bedtime.    Yes Historical Provider, MD  atorvastatin (LIPITOR) 40 MG tablet Take 40 mg by mouth at bedtime.    Yes Historical Provider, MD  carvedilol (COREG) 25 MG tablet Take 50 mg by mouth 2 (two) times daily with a meal.   Yes Historical Provider, MD  enalapril (VASOTEC) 5 MG tablet Take 5 mg by mouth 2 (two) times daily.   Yes Historical Provider, MD  fluocinonide cream (LIDEX) 0.05 % Apply 1 application topically 2 (two) times daily as needed. For rash   Yes Historical Provider, MD  furosemide (LASIX) 80 MG tablet Take 80 mg by mouth 2 (two) times daily.   Yes Historical Provider, MD  HYDROcodone-acetaminophen (NORCO/VICODIN) 5-325 MG per tablet Take 0.5-1 tablets by mouth daily as needed for moderate pain. For pain   Yes Historical Provider, MD  insulin aspart protamine- aspart (NOVOLOG 70/30) (70-30) 100 UNIT/ML injection Inject 2-9 Units into the skin 3 (three) times daily as needed. Sliding scale as directed   Yes Historical Provider,  MD  insulin glargine (LANTUS) 100 UNIT/ML injection Inject 2-3 Units into the skin at bedtime.   Yes Historical Provider, MD  phenytoin (DILANTIN) 100 MG ER capsule Take 300 mg by mouth at bedtime.    Yes Historical Provider, MD   BP 128/65 mmHg  Pulse 61  Temp(Src) 97.7 F (36.5 C) (Oral)  Resp 20  SpO2 100% Physical Exam  Constitutional: He is oriented to person, place, and time. He appears well-developed and well-nourished.  Alert and in no acute distress  HENT:  Head:  Normocephalic and atraumatic.  Cardiovascular: Normal rate, regular rhythm and normal heart sounds.  Exam reveals no gallop and no friction rub.   No murmur heard. Pulmonary/Chest: Effort normal and breath sounds normal. No respiratory distress. He has no wheezes. He has no rales. He exhibits no tenderness.  Abdominal: He exhibits no mass. There is no rebound and no guarding.  Abdomen soft, non-tender, non-distended Bowel sounds positive in all four quadrants  Musculoskeletal: He exhibits no edema.  Neurological: He is alert and oriented to person, place, and time.  Alert, oriented, thought content appropriate, able to give a coherent history. Speech is clear and goal oriented, able to follow commands.  Cranial Nerves:  II:  Peripheral visual fields grossly normal, pupils equal, round, reactive to light III, IV, VI: EOM intact bilaterally, ptosis not present V,VII: smile symmetric, eyes kept closed tightly against resistance, facial light touch sensation equal VIII: hearing grossly normal IX, X: symmetric soft palate movement, uvula elevates symmetrically  XI: bilateral shoulder shrug symmetric and strong XII: midline tongue extension 5/5 muscle strength in upper and lower extremities bilaterally including strong and equal grip strength and dorsiflexion/plantar flexion Sensory to light touch normal in all four extremities.  Normal finger-to-nose and rapid alternating movements  Skin: Skin is warm and dry. No rash noted.  Psychiatric: He has a normal mood and affect. His behavior is normal. Judgment and thought content normal.  Nursing note and vitals reviewed.   ED Course  Procedures (including critical care time) Labs Review Labs Reviewed  COMPREHENSIVE METABOLIC PANEL - Abnormal; Notable for the following:    Glucose, Bld 128 (*)    BUN 73 (*)    Creatinine, Ser 1.75 (*)    ALT 16 (*)    GFR calc non Af Amer 36 (*)    GFR calc Af Amer 42 (*)    All other components within  normal limits  CBC WITH DIFFERENTIAL/PLATELET - Abnormal; Notable for the following:    WBC 3.2 (*)    RBC 3.23 (*)    Hemoglobin 11.5 (*)    HCT 33.6 (*)    MCV 104.0 (*)    MCH 35.6 (*)    Platelets 129 (*)    Neutro Abs 1.6 (*)    All other components within normal limits    Imaging Review No results found. I have personally reviewed and evaluated these images and lab results as part of my medical decision-making.   EKG Interpretation None      MDM   Final diagnoses:  Anemia, unspecified anemia type  Myalgia   [name] presents with muscle cramps migrating between all four extremities. No focal neuro deficits. Benign exam.  Patient has recently started statin and dilantin which may be contributing to myalgias.   Labs: CBC and CMP reviewed and discussed with Dr. Corlis Leak- pt appears at baseline.   A&P: Myalgias  - PCP follow up, spoke with office and scheduled appointment for  patient tomorrow at 2:30.   Patient seen by and discussed with Dr. Corlis LeakMackuen who agrees with treatment plan.   Chase PicketJaime Pilcher Ward, PA-C 03/23/15 1634  Courteney Randall AnLyn Mackuen, MD 03/23/15 1702

## 2015-03-23 NOTE — Discharge Instructions (Signed)
You have an appointment at Dr. Recardo Evangelisthacker's office tomorrow (03/24/2015) at 2:30. You will be seeing the nurse practitioner, Victorino DikeJennifer, to review your medication and discuss today's diagnosis. Please take this paperwork with you. Return to the ED for any new or worsening symptoms, any additional concerns.

## 2015-03-29 ENCOUNTER — Encounter (INDEPENDENT_AMBULATORY_CARE_PROVIDER_SITE_OTHER): Payer: Self-pay | Admitting: Ophthalmology

## 2015-04-13 ENCOUNTER — Encounter (INDEPENDENT_AMBULATORY_CARE_PROVIDER_SITE_OTHER): Payer: Self-pay | Admitting: Ophthalmology

## 2015-05-19 ENCOUNTER — Encounter (INDEPENDENT_AMBULATORY_CARE_PROVIDER_SITE_OTHER): Payer: Self-pay | Admitting: Ophthalmology

## 2016-05-02 ENCOUNTER — Encounter (HOSPITAL_COMMUNITY): Payer: Self-pay | Admitting: Emergency Medicine

## 2016-05-02 ENCOUNTER — Emergency Department (HOSPITAL_COMMUNITY)
Admission: EM | Admit: 2016-05-02 | Discharge: 2016-05-18 | Disposition: E | Payer: Medicare Other | Attending: Emergency Medicine | Admitting: Emergency Medicine

## 2016-05-02 DIAGNOSIS — Z951 Presence of aortocoronary bypass graft: Secondary | ICD-10-CM | POA: Diagnosis not present

## 2016-05-02 DIAGNOSIS — Z794 Long term (current) use of insulin: Secondary | ICD-10-CM | POA: Diagnosis not present

## 2016-05-02 DIAGNOSIS — E119 Type 2 diabetes mellitus without complications: Secondary | ICD-10-CM | POA: Insufficient documentation

## 2016-05-02 DIAGNOSIS — Z7982 Long term (current) use of aspirin: Secondary | ICD-10-CM | POA: Diagnosis not present

## 2016-05-02 DIAGNOSIS — I469 Cardiac arrest, cause unspecified: Secondary | ICD-10-CM | POA: Diagnosis present

## 2016-05-02 DIAGNOSIS — I10 Essential (primary) hypertension: Secondary | ICD-10-CM | POA: Insufficient documentation

## 2016-05-02 DIAGNOSIS — Z79899 Other long term (current) drug therapy: Secondary | ICD-10-CM | POA: Diagnosis not present

## 2016-05-02 MED ORDER — EPINEPHRINE PF 1 MG/10ML IJ SOSY
PREFILLED_SYRINGE | INTRAMUSCULAR | Status: AC | PRN
  Administered 2016-05-02 (×2): 1 via INTRAVENOUS

## 2016-05-02 MED ORDER — CALCIUM CHLORIDE 10 % IV SOLN
INTRAVENOUS | Status: AC | PRN
  Administered 2016-05-02: 1 g via INTRAVENOUS

## 2016-05-02 MED ORDER — SODIUM BICARBONATE 8.4 % IV SOLN
INTRAVENOUS | Status: AC | PRN
  Administered 2016-05-02: 100 meq via INTRAVENOUS

## 2016-05-02 MED FILL — Medication: Qty: 1 | Status: AC

## 2016-05-18 NOTE — Progress Notes (Addendum)
Responded to CPR to support family. Patient deceased. Prayed with family per their request. Family did not know funeral home. Family needed to discuss with other family members. Patient placement card was given to youngest son with instructions on how to proceed. Remain with family until their departure.   09/18/16 1000  Clinical Encounter Type  Visited With Family;Patient and family together;Health care provider  Visit Type Initial;Spiritual support;Death;ED  Referral From Nurse  Spiritual Encounters  Spiritual Needs Prayer;Emotional;Grief support  Stress Factors  Family Stress Factors Loss  Venida JarvisWatlington, Glorie Dowlen, Chaplain,Pager [phone]

## 2016-05-18 NOTE — ED Provider Notes (Signed)
[facility]-EMERGENCY DEPT Provider Note   CSN: [id number] Arrival date & time: 05/11/2016  [id number]     History   Chief Complaint No chief complaint on file.   HPI [name] is a 76 y.o. male.  HPI Patient is a 76 year old male with a history of hypertension, congestive heart failure, diabetes, seizure disorder on Dilantin who presents to the emergency department from home unresponsive with approximate one hour of CPR.  Initial rhythm was V. fib.  The patient was also found to be hypoglycemic.  Patient was given an amp of dextrose by EMS as well as epinephine and 450 mg of amiodarone.  Despite all this patient remains in A. fib with no pulses present.  Presents with [name] device performing CPR.  Per family the patient called a family member on the phone at 2 AM with slurred speech and said he needed help.  The family member when and checked on him later this morning and found him unresponsive lying in bed with his eyes rolled back in his head.   Past Medical History:  Diagnosis Date  . CHF (congestive heart failure) (HCC)   . Diabetes mellitus without complication (HCC)   . Hypertension   . Seizures (HCC)     There are no active problems to display for this patient.   Past Surgical History:  Procedure Laterality Date  . CORONARY ARTERY BYPASS GRAFT         Home Medications    Prior to Admission medications   Medication Sig Start Date End Date Taking? Authorizing Provider  allopurinol (ZYLOPRIM) 100 MG tablet Take 100 mg by mouth daily.    Historical Provider, MD  amLODipine (NORVASC) 10 MG tablet Take 10 mg by mouth daily.    Historical Provider, MD  aspirin 325 MG EC tablet Take 325 mg by mouth at bedtime.     Historical Provider, MD  atorvastatin (LIPITOR) 40 MG tablet Take 40 mg by mouth at bedtime.     Historical Provider, MD  carvedilol (COREG) 25 MG tablet Take 50 mg by mouth 2 (two) times daily with a meal.    Historical Provider, MD  enalapril (VASOTEC) 5 MG tablet  Take 5 mg by mouth 2 (two) times daily.    Historical Provider, MD  fluocinonide cream (LIDEX) 0.05 % Apply 1 application topically 2 (two) times daily as needed. For rash    Historical Provider, MD  furosemide (LASIX) 80 MG tablet Take 80 mg by mouth 2 (two) times daily.    Historical Provider, MD  HYDROcodone-acetaminophen (NORCO/VICODIN) 5-325 MG per tablet Take 0.5-1 tablets by mouth daily as needed for moderate pain. For pain    Historical Provider, MD  insulin aspart protamine- aspart (NOVOLOG 70/30) (70-30) 100 UNIT/ML injection Inject 2-9 Units into the skin 3 (three) times daily as needed. Sliding scale as directed    Historical Provider, MD  insulin glargine (LANTUS) 100 UNIT/ML injection Inject 2-3 Units into the skin at bedtime.    Historical Provider, MD  phenytoin (DILANTIN) 100 MG ER capsule Take 300 mg by mouth at bedtime.     Historical Provider, MD    Family History No family history on file.  Social History Social History  Substance Use Topics  . Smoking status: Never Smoker  . Smokeless tobacco: Not on file  . Alcohol use No     Allergies   Codeine   Review of Systems Review of Systems  Unable to perform ROS: Intubated     Physical  Exam Updated Vital Signs There were no vitals taken for this visit.  Physical Exam  Constitutional: He appears well-developed and well-nourished.  HENT:  Head: Normocephalic and atraumatic.  Eyes:  Pupils fixed and dilated  Neck: No tracheal deviation present.  Cardiovascular:  Pulses present with CPR  Pulmonary/Chest:  Decreased air movement bilaterally with bag king ventilation  Abdominal: He exhibits no distension.  Musculoskeletal: He exhibits no deformity.  Neurological:  GCS 3  Psychiatric: He has a normal mood and affect.  Nursing note and vitals reviewed.    ED Treatments / Results  Labs (all labs ordered are listed, but only abnormal results are displayed) Labs Reviewed - No data to display  EKG  EKG  Interpretation None       Radiology No results found.  Procedures Procedures (including critical care time)   ++++++++++++++++++++++++++++++++++++++++++++++++++++++++++++  Cardiopulmonary Resuscitation (CPR) Procedure Note Directed/Performed by: Lyanne Co I personally directed ancillary staff and/or performed CPR in an effort to regain return of spontaneous circulation and to maintain cardiac, neuro and systemic perfusion.    INTUBATION Performed by: Lyanne Co Required items: required blood products, implants, devices, and special equipment available Patient identity confirmed: provided demographic data and hospital-assigned identification number Time out: Immediately prior to procedure a "time out" was called to verify the correct patient, procedure, equipment, support staff and site/side marked as required. Indications: cardiac arrest Intubation method: Glidescope Laryngoscopy  Preoxygenation: Bag King Sedatives: none Paralytic: none Tube Size: 7.5 cuffed Post-procedure assessment: chest rise and ETCO2 monitor Breath sounds: equal and absent over the epigastrium Tube secured with: ETT holder      EMERGENCY DEPARTMENT [place] CARDIAC EXAM "Study: Limited Ultrasound of the Heart and Pericardium" INDICATIONS:Cardiac arrest Multiple views of the heart and pericardium were obtained in real-time with a multi-frequency probe. PERFORMED ZO:XWRUEA IMAGES ARCHIVED?: Yes LIMITATIONS:  Emergent procedure VIEWS USED: Subcostal 4 chamber INTERPRETATION: Pericardial effusioin absent, Cardiac tamponade absent and No cardiac activity     ++++++++++++++++++++++++++++++++++++++++++++++++++++++++++++++++++++     Medications Ordered in ED Medications  EPINEPHrine (ADRENALIN) 1 MG/10ML injection (1 Syringe Intravenous Given 07-May-2016 0908)  sodium bicarbonate injection (100 mEq Intravenous Given 05-07-2016 0905)  calcium chloride injection (1 g Intravenous Given 05/07/16 0905)       Initial Impression / Assessment and Plan / ED Course  I have reviewed the triage vital signs and the nursing notes.  Pertinent labs & imaging results that were available during my care of the patient were reviewed by me and considered in my medical decision making (see chart for details).     King airway exchange for endotracheal tube.  Standard ACLS on arrival and CPR.  Despite all of this the patient was unable to regain pulses.  Time of death 103.  Findings V. fib still present on monitor.  Bedside ultrasound demonstrates no cardiac activity.  Family updated.  Final Clinical Impressions(s) / ED Diagnoses   Final diagnoses:  Cardiac arrest [facility])    New Prescriptions New Prescriptions   No medications on file     Azalia Bilis, MD 2016/05/07 [phone]

## 2016-05-18 NOTE — ED Triage Notes (Signed)
[facility], CPR in progress for 35 min PTA. Per EMS, family reported speaking to pt on phone at 0200, hearing slurred speech. Family unable to check on pt until this morning. FD arrived to pt, pulseless, breathless and CPR began at 0825. Per EMS, pt received 6 Epi, 450 of Amio, shocks x 4, and D10 for CBG of 58 PTA. Coarse Vfib PTA, IO access present in LLE and King airway established.

## 2016-05-18 NOTE — Progress Notes (Signed)
Pt was intubated by EDP with 7.5 ETT and secured with Hollister tube holder by RT. Positive color change present on ETCO2 detector. Cont CPR after intubation. MD called code thereafter.

## 2016-05-18 DEATH — deceased
